# Patient Record
Sex: Female | Born: 1950
Health system: Southern US, Community
[De-identification: ages and names within clinical notes are randomized; demographics above are authoritative.]

## PROBLEM LIST (undated history)

## (undated) DIAGNOSIS — E039 Hypothyroidism, unspecified: Secondary | ICD-10-CM

## (undated) DIAGNOSIS — F419 Anxiety disorder, unspecified: Secondary | ICD-10-CM

## (undated) DIAGNOSIS — N879 Dysplasia of cervix uteri, unspecified: Secondary | ICD-10-CM

## (undated) DIAGNOSIS — T7840XA Allergy, unspecified, initial encounter: Secondary | ICD-10-CM

## (undated) DIAGNOSIS — K760 Fatty (change of) liver, not elsewhere classified: Secondary | ICD-10-CM

## (undated) HISTORY — PX: COLONOSCOPY: SHX174

## (undated) HISTORY — DX: Fatty (change of) liver, not elsewhere classified: K76.0

## (undated) HISTORY — PX: GYNECOLOGIC CRYOSURGERY: SHX857

## (undated) HISTORY — DX: Anxiety disorder, unspecified: F41.9

## (undated) HISTORY — DX: Hypothyroidism, unspecified: E03.9

## (undated) HISTORY — DX: Dysplasia of cervix uteri, unspecified: N87.9

## (undated) HISTORY — DX: Allergy, unspecified, initial encounter: T78.40XA

## (undated) HISTORY — PX: COLPOSCOPY: SHX161

---

## 1997-12-14 ENCOUNTER — Other Ambulatory Visit: Admission: RE | Admit: 1997-12-14 | Discharge: 1997-12-14 | Payer: Self-pay | Admitting: Gynecology

## 1998-12-27 ENCOUNTER — Other Ambulatory Visit: Admission: RE | Admit: 1998-12-27 | Discharge: 1998-12-27 | Payer: Self-pay | Admitting: Gynecology

## 2000-02-10 ENCOUNTER — Other Ambulatory Visit: Admission: RE | Admit: 2000-02-10 | Discharge: 2000-02-10 | Payer: Self-pay | Admitting: Gynecology

## 2001-02-21 ENCOUNTER — Other Ambulatory Visit: Admission: RE | Admit: 2001-02-21 | Discharge: 2001-02-21 | Payer: Self-pay | Admitting: Gynecology

## 2002-03-11 ENCOUNTER — Other Ambulatory Visit: Admission: RE | Admit: 2002-03-11 | Discharge: 2002-03-11 | Payer: Self-pay | Admitting: Gynecology

## 2002-03-15 ENCOUNTER — Other Ambulatory Visit: Admission: RE | Admit: 2002-03-15 | Discharge: 2002-03-15 | Payer: Self-pay | Admitting: Gynecology

## 2003-06-22 ENCOUNTER — Other Ambulatory Visit: Admission: RE | Admit: 2003-06-22 | Discharge: 2003-06-22 | Payer: Self-pay | Admitting: Gynecology

## 2004-03-16 ENCOUNTER — Other Ambulatory Visit: Admission: RE | Admit: 2004-03-16 | Discharge: 2004-03-16 | Payer: Self-pay | Admitting: Gynecology

## 2005-05-31 ENCOUNTER — Other Ambulatory Visit: Admission: RE | Admit: 2005-05-31 | Discharge: 2005-05-31 | Payer: Self-pay | Admitting: Gynecology

## 2006-07-02 ENCOUNTER — Ambulatory Visit: Payer: Self-pay | Admitting: Internal Medicine

## 2006-07-30 ENCOUNTER — Other Ambulatory Visit: Admission: RE | Admit: 2006-07-30 | Discharge: 2006-07-30 | Payer: Self-pay | Admitting: Gynecology

## 2006-10-15 ENCOUNTER — Ambulatory Visit: Payer: Self-pay | Admitting: Gastroenterology

## 2007-10-10 ENCOUNTER — Other Ambulatory Visit: Admission: RE | Admit: 2007-10-10 | Discharge: 2007-10-10 | Payer: Self-pay | Admitting: Gynecology

## 2008-05-26 ENCOUNTER — Ambulatory Visit: Payer: Self-pay | Admitting: Family Medicine

## 2009-09-01 ENCOUNTER — Other Ambulatory Visit: Admission: RE | Admit: 2009-09-01 | Discharge: 2009-09-01 | Payer: Self-pay | Admitting: Gynecology

## 2009-09-01 ENCOUNTER — Ambulatory Visit: Payer: Self-pay | Admitting: Gynecology

## 2010-08-01 ENCOUNTER — Ambulatory Visit: Payer: Self-pay | Admitting: Family Medicine

## 2010-09-05 ENCOUNTER — Encounter: Payer: Self-pay | Admitting: Gynecology

## 2010-09-06 ENCOUNTER — Other Ambulatory Visit: Payer: Self-pay | Admitting: Gynecology

## 2010-09-06 ENCOUNTER — Encounter (INDEPENDENT_AMBULATORY_CARE_PROVIDER_SITE_OTHER): Payer: BC Managed Care – PPO | Admitting: Gynecology

## 2010-09-06 ENCOUNTER — Other Ambulatory Visit (HOSPITAL_COMMUNITY)
Admission: RE | Admit: 2010-09-06 | Discharge: 2010-09-06 | Disposition: A | Discharge status: Home / Self Care | Payer: BC Managed Care – PPO | Source: Ambulatory Visit | Attending: Gynecology | Admitting: Gynecology

## 2010-09-06 DIAGNOSIS — Z01419 Encounter for gynecological examination (general) (routine) without abnormal findings: Secondary | ICD-10-CM

## 2010-09-06 DIAGNOSIS — Z124 Encounter for screening for malignant neoplasm of cervix: Secondary | ICD-10-CM | POA: Insufficient documentation

## 2010-09-06 DIAGNOSIS — R82998 Other abnormal findings in urine: Secondary | ICD-10-CM

## 2010-10-03 ENCOUNTER — Other Ambulatory Visit: Payer: Self-pay | Admitting: Gynecology

## 2010-10-03 DIAGNOSIS — Z5189 Encounter for other specified aftercare: Secondary | ICD-10-CM

## 2010-10-03 DIAGNOSIS — R82998 Other abnormal findings in urine: Secondary | ICD-10-CM

## 2010-10-10 ENCOUNTER — Other Ambulatory Visit: Payer: BC Managed Care – PPO

## 2010-10-20 ENCOUNTER — Ambulatory Visit: Payer: Self-pay | Admitting: Family Medicine

## 2011-08-25 IMAGING — CR DG CHEST 2V
1 series · 2 of 2 positions shown · non-contrast
Comparison: none

REASON FOR EXAM: cough
COMMENTS:

PROCEDURE:     KDR - KDXR CHEST PA (OR AP) AND LAT  - October 20, 2010  [DATE]
RESULT:     The lungs are well-expanded and clear. The cardiac silhouette is
normal in size. The pulmonary vascularity is not engorged. There is no
pleural effusion. The bony structures are grossly normal.

[Series 1: view not recorded · 0.17mm/px · 2 of 2 slices shown]
[im 1/2]
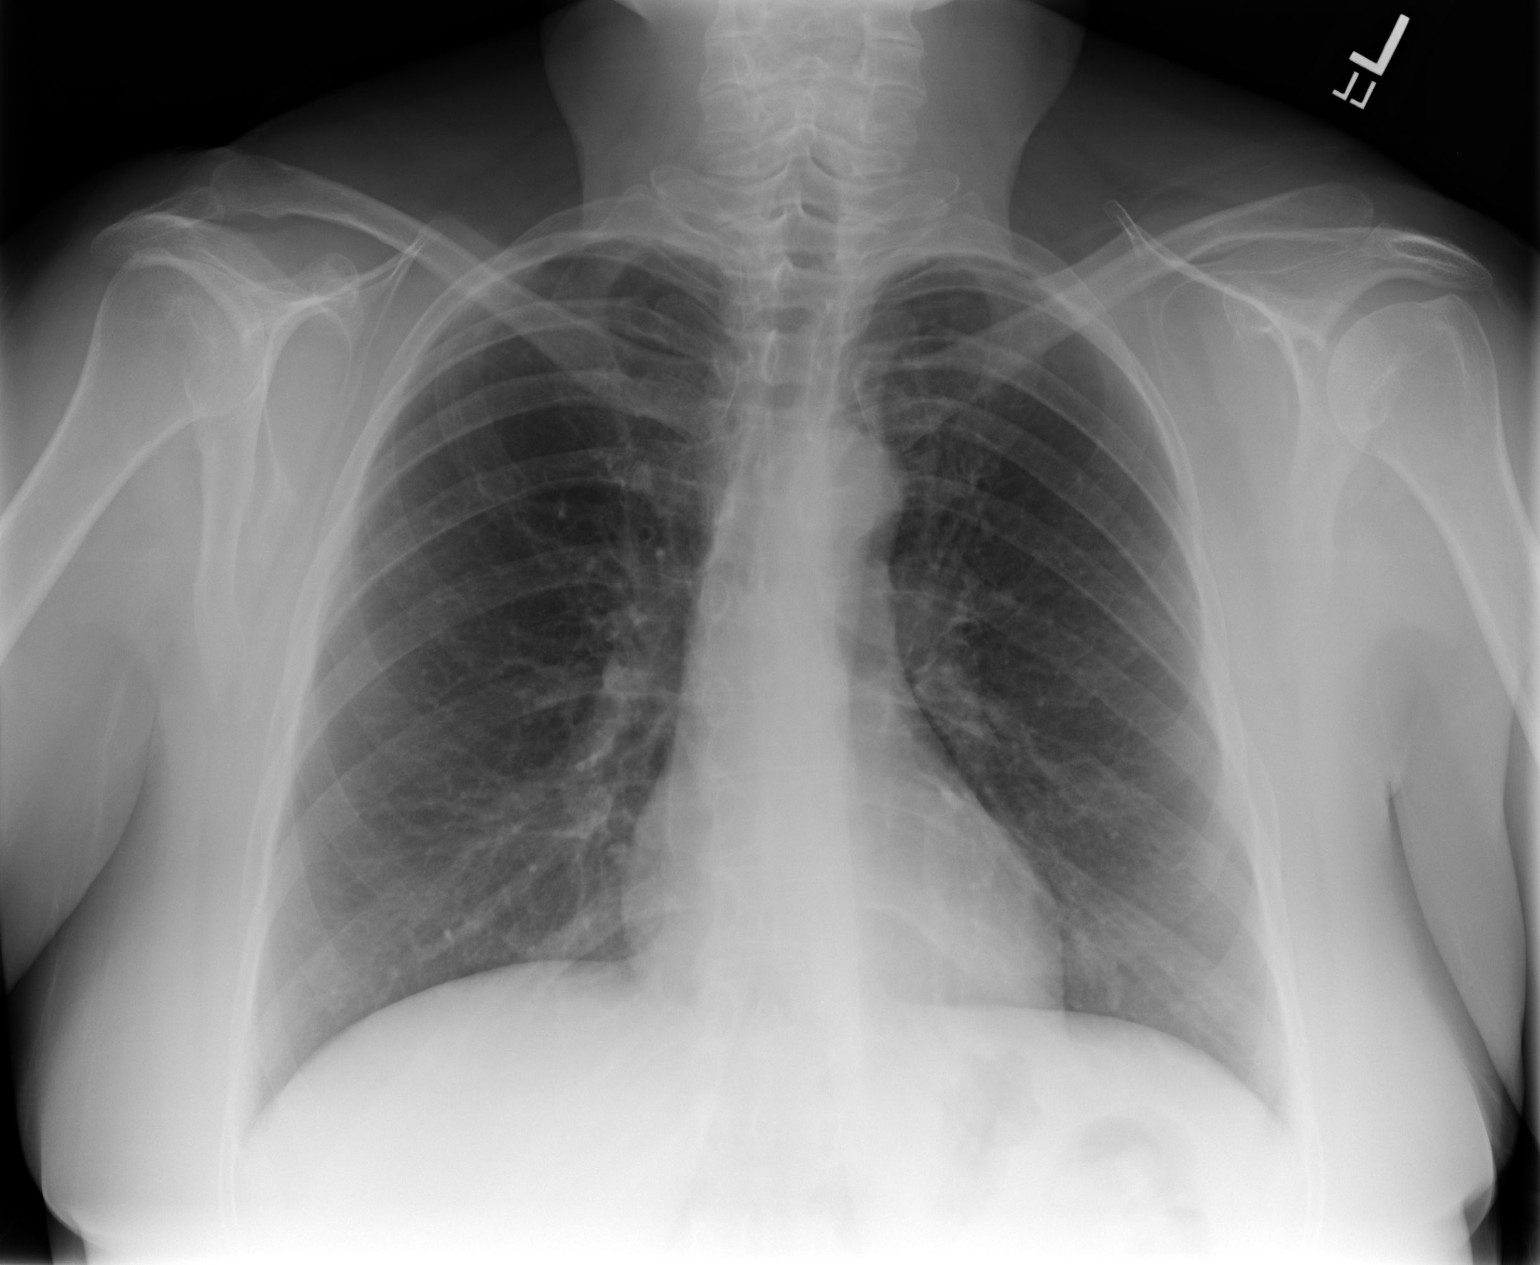
[im 2/2]
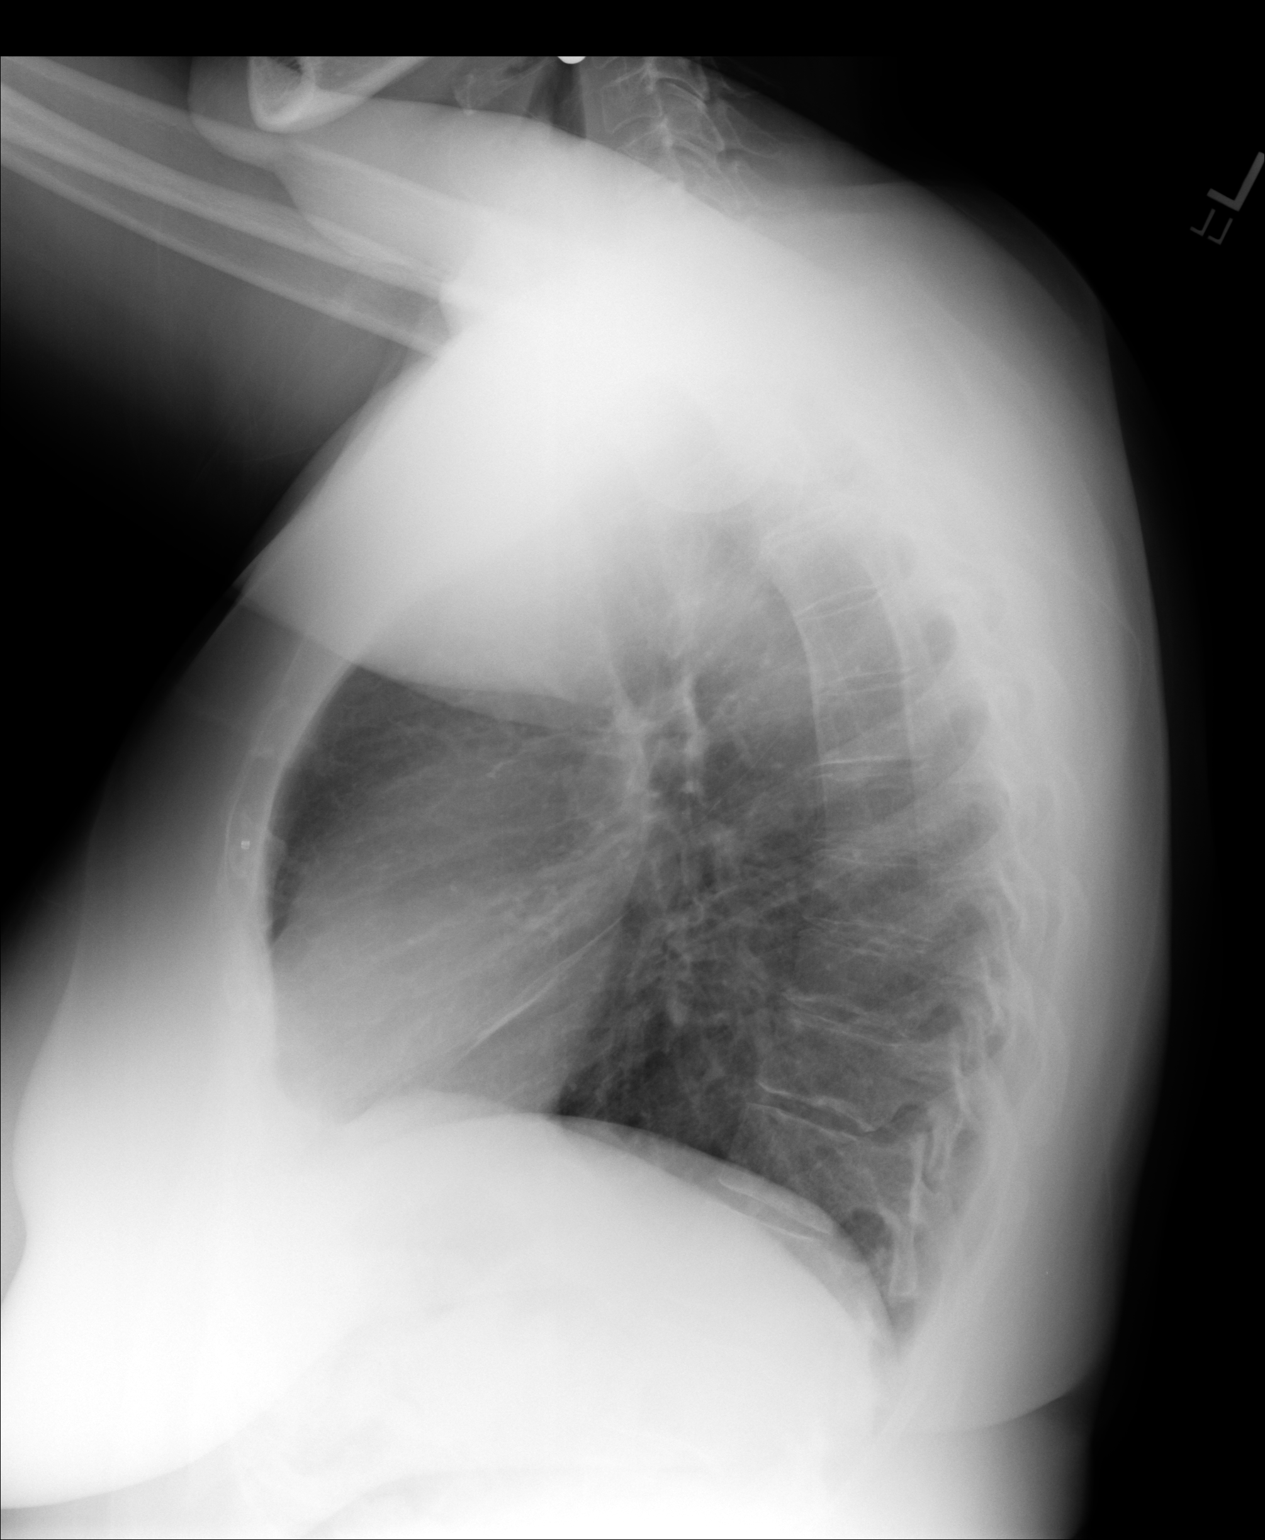

[2 of 2 positions shown; findings below may reference images not displayed]

IMPRESSION: I do not see evidence of acute cardiopulmonary abnormality.

## 2011-10-25 ENCOUNTER — Other Ambulatory Visit: Payer: Self-pay | Admitting: *Deleted

## 2011-10-25 ENCOUNTER — Encounter: Payer: Self-pay | Admitting: *Deleted

## 2011-10-25 MED ORDER — LEVOTHYROXINE SODIUM 88 MCG PO TABS: 88.0000 ug | ORAL_TABLET | Freq: Every day | ORAL | Status: DC

## 2011-10-25 NOTE — Progress Notes (Signed)
Patient ID: Christina Mcgee, female   DOB: [birth year], 61 y.o.   MRN: [id number] rx levothyroxine (synthroid ) 88 mcg #30 0 refills called into pharmacy. originally Rx was printed at office.

## 2011-10-25 NOTE — Addendum Note (Signed)
Addended by: Richardson Chiquito on: 10/25/2011 10:32 AM   Modules accepted: Orders

## 2011-10-25 NOTE — Telephone Encounter (Signed)
Tried calling pt to schedule aex. Her cellphone didn't have VM set up.

## 2012-06-14 ENCOUNTER — Encounter: Payer: BC Managed Care – PPO | Admitting: Gynecology

## 2012-06-21 ENCOUNTER — Encounter: Payer: Self-pay | Admitting: Gynecology

## 2012-06-21 ENCOUNTER — Ambulatory Visit (INDEPENDENT_AMBULATORY_CARE_PROVIDER_SITE_OTHER): Payer: BC Managed Care – PPO | Admitting: Gynecology

## 2012-06-21 VITALS — BP 120/76 | Ht 65.0 in | Wt 200.0 lb

## 2012-06-21 DIAGNOSIS — Z01419 Encounter for gynecological examination (general) (routine) without abnormal findings: Secondary | ICD-10-CM

## 2012-06-21 NOTE — Patient Instructions (Signed)
Follow up one year for annual exam 

## 2012-06-21 NOTE — Progress Notes (Signed)
Christina Mcgee [birth year] [id number]        62 y.o.  G2P2 for annual exam.  Doing well without complaints.  Past medical history,surgical history, medications, allergies, family history and social history were all reviewed and documented in the EPIC chart. ROS:  Was performed and pertinent positives and negatives are included in the history.  Exam: Kim assistant Filed Vitals:   06/21/12 1428  BP: 120/76  Height: 5\' 5"  (1.651 m)  Weight: 200 lb (90.719 kg)   General appearance  Normal Skin grossly normal Head/Neck normal with no cervical or supraclavicular adenopathy thyroid normal Lungs  clear Cardiac RR, without RMG Abdominal  soft, nontender, without masses, organomegaly or hernia Breasts  examined lying and sitting without masses, retractions, discharge or axillary adenopathy. Pelvic  Ext/BUS/vagina  normal   Cervix  normal   Uterus  axial, normal size, shape and contour, midline and mobile nontender   Adnexa  Without masses or tenderness    Anus and perineum  normal   Rectovaginal  normal sphincter tone without palpated masses or tenderness.    Assessment/Plan:  62 y.o. G2P2 female for annual exam.   1. Postmenopausal without significant symptoms. We'll continue to monitor.  Patient does report any bleeding. 2. Mammography 06/2012. Continued annual mammography. SBE monthly reviewed. 3. Pap smear 2012. No Pap smear done today. History of cryosurgery around age 61 with all subsequent Pap smears normal. Plan repeat Pap smear next year 3 year interval. 4. DEXA 2009 normal. Plan repeat closer to 65. Increase calcium vitamin D reviewed. Patient has been noted to be low on vitamin D previously have asked her to ensure she has this checked when she has routine blood work done at her primary physician's office. 5. Colonoscopy 5 years ago. Repeat at their recommended interval. 6. Health maintenance. No lab work done as it is all done through her primary physician's office. Follow up one  year, sooner as needed.    Dara Lords MD, 2:55 PM 06/21/2012

## 2012-06-25 ENCOUNTER — Encounter: Payer: Self-pay | Admitting: Gynecology

## 2012-06-27 ENCOUNTER — Encounter: Payer: Self-pay | Admitting: Gynecology

## 2013-07-28 ENCOUNTER — Ambulatory Visit (INDEPENDENT_AMBULATORY_CARE_PROVIDER_SITE_OTHER): Payer: No Typology Code available for payment source | Admitting: Family Medicine

## 2013-07-28 ENCOUNTER — Encounter: Payer: Self-pay | Admitting: Family Medicine

## 2013-07-28 ENCOUNTER — Other Ambulatory Visit: Payer: Self-pay | Admitting: Family Medicine

## 2013-07-28 VITALS — BP 120/78 | HR 61 | Temp 98.2°F | Resp 16 | Ht 65.0 in | Wt 195.0 lb

## 2013-07-28 DIAGNOSIS — Z0000 Encounter for general adult medical examination without abnormal findings: Secondary | ICD-10-CM

## 2013-07-28 DIAGNOSIS — F410 Panic disorder [episodic paroxysmal anxiety] without agoraphobia: Secondary | ICD-10-CM

## 2013-07-28 DIAGNOSIS — Z01419 Encounter for gynecological examination (general) (routine) without abnormal findings: Secondary | ICD-10-CM

## 2013-07-28 DIAGNOSIS — E039 Hypothyroidism, unspecified: Secondary | ICD-10-CM

## 2013-07-28 DIAGNOSIS — Z13220 Encounter for screening for lipoid disorders: Secondary | ICD-10-CM

## 2013-07-28 DIAGNOSIS — J3089 Other allergic rhinitis: Secondary | ICD-10-CM

## 2013-07-28 DIAGNOSIS — R413 Other amnesia: Secondary | ICD-10-CM

## 2013-07-28 DIAGNOSIS — F411 Generalized anxiety disorder: Secondary | ICD-10-CM

## 2013-07-28 DIAGNOSIS — Z131 Encounter for screening for diabetes mellitus: Secondary | ICD-10-CM

## 2013-07-28 LAB — POCT URINALYSIS DIPSTICK
BILIRUBIN UA: NEGATIVE
GLUCOSE UA: NEGATIVE
Ketones, UA: NEGATIVE
NITRITE UA: NEGATIVE
Protein, UA: NEGATIVE
SPEC GRAV UA: 1.01
Urobilinogen, UA: 0.2
pH, UA: 7

## 2013-07-28 MED ORDER — LEVOTHYROXINE SODIUM 88 MCG PO TABS: 88.0000 ug | ORAL_TABLET | Freq: Every day | ORAL | Status: DC

## 2013-07-28 MED ORDER — CITALOPRAM HYDROBROMIDE 20 MG PO TABS: 20.0000 mg | ORAL_TABLET | Freq: Every day | ORAL | Status: DC

## 2013-07-28 NOTE — Progress Notes (Signed)
Subjective:    Patient ID: Christina Mcgee, female    DOB: February 17, 1951, 63 y.o.   MRN: [id number]  HPI This chart was scribed for Wardell Honour, MD by Thea Alken, ED Scribe. This patient was seen in room 21 and the patient's care was started at 12:49 PM.  HPI Comments: Christina Mcgee is a 63 y.o. female who presents to the [facility] to re-establish care and for a physical. Pt reports anxiety attack 2 weeks ago that lasted a couple of hours. During the episode pt felt confused, apprehensive and nervous. She reports her sister calmed her down by talking her through the episode. At that time pt was tapering off celexa taking it once a day. Pt believes this may have been the cause of the episode. Pt denies SOB or palpitation during the attack. This was pt first anxiety attack and states she was under a lot of stress.   Pt c/o of confusion. She reports that she does not think as fast as she use to. She often tends to lose items. She also states she forgets why she goes to places like the grocery store..Pt is now more apprehensive about going places. Pt reports her thyroid was last checked by her chiropractor in the fall.   Pt reports worsening hearing. Pt denies tinnitus, HA, SOB, CP, dizziness and visual disturbance.  Pt denies cataract and glaucoma. Pt reports palpitation every once in while but denies daily palpitations. Pt denies numbness and tingling in legs or leg swelling. Pt denies change in BM. She denies abdominal pain, and bladder and bowel incontinence. She reports occasional  GERD. Pt reports she goes to bed around 11-12 and sleeps until 9-10.   Pt had c-section. Pt denies any other surgeries.  Pt has been happily married for 35 years with 3 children, twins 70, and 47. She also has 4 grandchildren. Pt lives with husband. 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 Pt is an occasion drinker. Pt exercises 1-2 times a week. Pt is considering weight watchers.  Pt denies allergies.  Pt mother passed away at 31 from leukemia. She also had breast cancer at 58. Pt father died ats 79 from CHF. He also had  HTN and high cholesterol. She denies him having and MI or TIA. Pt sister reports bladder cancer. Pt brother has HTN.     Pt last pap smear 09/06/10 normal.  Pt last physical was 1 year ago.  Pt last mammogram 1 year ago. Pt last colonoscopy over 2 years ago. TDAP-1 year ago Pt has not received flu shot this year. Pt is due for an eye exam.  Pt has seen dentist recently.   Past Medical History  Diagnosis Date  . Hypothyroid   . Cervical dysplasia age 76  . Anxiety   . Fatty liver disease, nonalcoholic     s/p GI consultation; [facility] in [place].   No Known Allergies Prior to Admission medications   Medication Sig Start Date End Date Taking? Authorizing Provider  Citalopram Hydrobromide (CELEXA PO) Take 20 mg by mouth daily.     Historical Provider, MD  levothyroxine (SYNTHROID) 88 MCG tablet Take 1 tablet (88 mcg total) by mouth daily. 10/25/11 10/24/12  Anastasio Auerbach, MD   Review of Systems  Constitutional: Negative for fever, chills, diaphoresis, activity change, appetite change, fatigue and unexpected weight change.  HENT: Negative for congestion, dental problem, drooling, ear discharge, ear  pain, facial swelling, hearing loss, mouth sores, nosebleeds, postnasal drip, rhinorrhea, sinus pressure, sneezing, sore throat, tinnitus, trouble swallowing and voice change.   Eyes: Negative for photophobia, pain, discharge, redness, itching and visual disturbance.  Respiratory: Negative for apnea, cough, choking, chest tightness, shortness of breath, wheezing and stridor.   Cardiovascular: Negative for chest pain, palpitations and leg swelling.  Gastrointestinal: Negative for nausea, vomiting, abdominal pain, diarrhea, constipation, blood in stool, abdominal distention, anal bleeding and rectal pain.  Endocrine: Negative for cold intolerance,  heat intolerance, polydipsia, polyphagia and polyuria.  Genitourinary: Negative for dysuria, urgency, frequency, hematuria, flank pain, decreased urine volume, vaginal bleeding, vaginal discharge, enuresis, difficulty urinating, genital sores, vaginal pain, menstrual problem, pelvic pain and dyspareunia.  Musculoskeletal: Negative for myalgias, back pain, joint swelling, arthralgias, gait problem, neck pain and neck stiffness.  Skin: Negative for color change, pallor, rash and wound.  Allergic/Immunologic: Negative for environmental allergies, food allergies and immunocompromised state.  Neurological: Negative for dizziness, tremors, seizures, syncope, facial asymmetry, speech difficulty, weakness, light-headedness, numbness and headaches.  Hematological: Negative for adenopathy. Does not bruise/bleed easily.  Psychiatric/Behavioral: Positive for decreased concentration. Negative for suicidal ideas, hallucinations, behavioral problems, confusion, sleep disturbance, self-injury, dysphoric mood and agitation. The patient is nervous/anxious. The patient is not hyperactive.    .      Objective:   Physical Exam  Constitutional: She is oriented to person, place, and time. She appears well-developed and well-nourished. No distress.  HENT:  Head: Normocephalic and atraumatic.  Right Ear: External ear normal.  Left Ear: External ear normal.  Nose: Nose normal.  Mouth/Throat: Oropharynx is clear and moist.  Eyes: Conjunctivae and EOM are normal. Pupils are equal, round, and reactive to light.  Neck: Normal range of motion and full passive range of motion without pain. Neck supple. No JVD present. Carotid bruit is not present. No thyromegaly present.  Cardiovascular: Normal rate, regular rhythm and normal heart sounds.  Exam reveals no gallop and no friction rub.   No murmur heard. Pulmonary/Chest: Effort normal and breath sounds normal. No respiratory distress. She has no wheezes. She has no rales.  She exhibits no tenderness. Right breast exhibits no inverted nipple, no mass, no nipple discharge, no skin change and no tenderness. Left breast exhibits no inverted nipple, no mass, no nipple discharge, no skin change and no tenderness. Breasts are symmetrical.  Abdominal: Soft. Bowel sounds are normal. She exhibits no distension and no mass. There is no tenderness. There is no rebound and no guarding.  Genitourinary: Vagina normal and uterus normal. There is no rash, tenderness, lesion or injury on the right labia. There is no rash, tenderness, lesion or injury on the left labia. Cervix exhibits motion tenderness. Right adnexum displays no mass, no tenderness and no fullness. Left adnexum displays no mass, no tenderness and no fullness.  Musculoskeletal: Normal range of motion.       Right shoulder: Normal.       Left shoulder: Normal.       Cervical back: Normal.  Lymphadenopathy:    She has no cervical adenopathy.  Neurological: She is alert and oriented to person, place, and time. She has normal reflexes. No cranial nerve deficit. She exhibits normal muscle tone. Coordination normal.  Skin: Skin is warm and dry. No rash noted. She is not diaphoretic. No erythema. No pallor.  Psychiatric: She has a normal mood and affect. Her behavior is normal. Judgment and thought content normal.  Nursing note and vitals reviewed.  Assessment & Plan:  Routine general medical examination at a health care facility - Plan: EKG 12-Lead, POCT urinalysis dipstick, Pap IG w/ reflex to HPV when ASC-U  Hypothyroidism, unspecified hypothyroidism type - Plan: TSH, levothyroxine (SYNTHROID) 88 MCG tablet  Generalized anxiety disorder - Plan: citalopram (CELEXA) 20 MG tablet  Panic attack - Plan: citalopram (CELEXA) 20 MG tablet  Memory difficulty - Plan: CBC with Differential, COMPLETE METABOLIC PANEL WITH GFR, TSH, POCT urinalysis dipstick  Other allergic rhinitis - Plan: loratadine (CLARITIN) 10 MG  tablet  Screening for diabetes mellitus - Plan: COMPLETE METABOLIC PANEL WITH GFR, Hemoglobin A1c  Screening, lipid - Plan: Lipid panel   1. Complete Physical Examination: anticipatory guidance --- weight loss, exercise, 3 servings of dairy daily.  Pap smear obtained; mammogram and colonoscopy UTD.  Immunizations UTD. 2.  Gyncological exam: Pap smear obtained; mammogram UTD.  Post-menopausal. 3.  Hypothyroidism: obtain labs; refill provided. 4.  Anxiety/panic attacks: worsening; refill of Citalopram provided; encourage pt to take daily. 5.  Allergic Rhinitis: Controlled; continue Claritin daily. 6. Memory issues: New. Obtain labs including TSH, B12.  RTC in upcoming three months to undergo MMSE and address further.   Meds ordered this encounter  Medications  . loratadine (CLARITIN) 10 MG tablet    Sig: Take 10 mg by mouth daily.  Marland Kitchen DISCONTD: levothyroxine (SYNTHROID) 88 MCG tablet    Sig: Take 1 tablet (88 mcg total) by mouth daily.    Dispense:  30 tablet    Refill:  11  . citalopram (CELEXA) 20 MG tablet    Sig: Take 1 tablet (20 mg total) by mouth daily.    Dispense:  30 tablet    Refill:  11  . levothyroxine (SYNTHROID) 88 MCG tablet    Sig: Take 1 tablet (88 mcg total) by mouth daily.    Dispense:  30 tablet    Refill:  11   I personally performed the services described in this documentation, which was scribed in my presence.  The recorded information has been reviewed and is accurate.  Reginia Forts, M.D.  [facility] [address] [place], [place]  [address] [phone] phone [phone] fax

## 2013-07-29 LAB — CBC WITH DIFFERENTIAL/PLATELET
BASOS ABS: 0 10*3/uL (ref 0.0–0.1)
BASOS PCT: 0 % (ref 0–1)
EOS PCT: 2 % (ref 0–5)
Eosinophils Absolute: 0.1 10*3/uL (ref 0.0–0.7)
HEMATOCRIT: 40.8 % (ref 36.0–46.0)
Hemoglobin: 13.6 g/dL (ref 12.0–15.0)
Lymphocytes Relative: 38 % (ref 12–46)
Lymphs Abs: 2.5 10*3/uL (ref 0.7–4.0)
MCH: 30.4 pg (ref 26.0–34.0)
MCHC: 33.3 g/dL (ref 30.0–36.0)
MCV: 91.3 fL (ref 78.0–100.0)
MONO ABS: 0.5 10*3/uL (ref 0.1–1.0)
Monocytes Relative: 8 % (ref 3–12)
NEUTROS ABS: 3.4 10*3/uL (ref 1.7–7.7)
Neutrophils Relative %: 52 % (ref 43–77)
Platelets: 258 10*3/uL (ref 150–400)
RBC: 4.47 MIL/uL (ref 3.87–5.11)
RDW: 12.7 % (ref 11.5–15.5)
WBC: 6.6 10*3/uL (ref 4.0–10.5)

## 2013-07-29 LAB — COMPLETE METABOLIC PANEL WITH GFR
ALK PHOS: 75 U/L (ref 39–117)
ALT: 51 U/L — ABNORMAL HIGH (ref 0–35)
AST: 34 U/L (ref 0–37)
Albumin: 4.4 g/dL (ref 3.5–5.2)
BILIRUBIN TOTAL: 0.6 mg/dL (ref 0.2–1.2)
BUN: 12 mg/dL (ref 6–23)
CO2: 28 mEq/L (ref 19–32)
CREATININE: 0.7 mg/dL (ref 0.50–1.10)
Calcium: 10.3 mg/dL (ref 8.4–10.5)
Chloride: 104 mEq/L (ref 96–112)
GFR, Est African American: >89 mL/min
GFR, Est Non African American: >89 mL/min
Glucose, Bld: 92 mg/dL (ref 70–99)
Potassium: 5.1 mEq/L (ref 3.5–5.3)
Sodium: 139 mEq/L (ref 135–145)
Total Protein: 7.4 g/dL (ref 6.0–8.3)

## 2013-07-29 LAB — PAP IG W/ RFLX HPV ASCU

## 2013-07-29 LAB — LIPID PANEL
Cholesterol: 192 mg/dL (ref 0–200)
HDL: 58 mg/dL (ref >39)
LDL Cholesterol: 122 mg/dL — ABNORMAL HIGH (ref 0–99)
TRIGLYCERIDES: 61 mg/dL (ref <150)
Total CHOL/HDL Ratio: 3.3 Ratio
VLDL: 12 mg/dL (ref 0–40)

## 2013-07-29 LAB — HEMOGLOBIN A1C
HEMOGLOBIN A1C: 5.4 % (ref <5.7)
MEAN PLASMA GLUCOSE: 108 mg/dL (ref <117)

## 2013-07-29 LAB — TSH: TSH: 1.936 u[IU]/mL (ref 0.350–4.500)

## 2013-07-30 LAB — RPR

## 2013-07-30 LAB — VITAMIN B12: VITAMIN B 12: 306 pg/mL (ref 211–911)

## 2013-08-06 LAB — HUMAN PAPILLOMAVIRUS, HIGH RISK: HPV DNA High Risk: NOT DETECTED

## 2013-10-20 ENCOUNTER — Ambulatory Visit (INDEPENDENT_AMBULATORY_CARE_PROVIDER_SITE_OTHER): Payer: No Typology Code available for payment source | Admitting: Family Medicine

## 2013-10-20 ENCOUNTER — Encounter: Payer: Self-pay | Admitting: Family Medicine

## 2013-10-20 VITALS — BP 117/70 | HR 64 | Temp 98.0°F | Resp 16 | Ht 64.75 in | Wt 199.4 lb

## 2013-10-20 DIAGNOSIS — E669 Obesity, unspecified: Secondary | ICD-10-CM

## 2013-10-20 DIAGNOSIS — F329 Major depressive disorder, single episode, unspecified: Secondary | ICD-10-CM

## 2013-10-20 DIAGNOSIS — R413 Other amnesia: Secondary | ICD-10-CM

## 2013-10-20 DIAGNOSIS — F3289 Other specified depressive episodes: Secondary | ICD-10-CM

## 2013-10-20 DIAGNOSIS — K760 Fatty (change of) liver, not elsewhere classified: Secondary | ICD-10-CM

## 2013-10-20 DIAGNOSIS — E039 Hypothyroidism, unspecified: Secondary | ICD-10-CM

## 2013-10-20 DIAGNOSIS — R748 Abnormal levels of other serum enzymes: Secondary | ICD-10-CM

## 2013-10-20 DIAGNOSIS — F32A Depression, unspecified: Secondary | ICD-10-CM

## 2013-10-20 DIAGNOSIS — K7689 Other specified diseases of liver: Secondary | ICD-10-CM

## 2013-10-20 LAB — COMPREHENSIVE METABOLIC PANEL
ALBUMIN: 4.4 g/dL (ref 3.5–5.2)
ALK PHOS: 64 U/L (ref 39–117)
ALT: 23 U/L (ref 0–35)
AST: 24 U/L (ref 0–37)
BUN: 11 mg/dL (ref 6–23)
CALCIUM: 9.8 mg/dL (ref 8.4–10.5)
CHLORIDE: 104 meq/L (ref 96–112)
CO2: 26 mEq/L (ref 19–32)
Creat: 0.61 mg/dL (ref 0.50–1.10)
GLUCOSE: 79 mg/dL (ref 70–99)
Potassium: 4.5 mEq/L (ref 3.5–5.3)
Sodium: 137 mEq/L (ref 135–145)
TOTAL PROTEIN: 7 g/dL (ref 6.0–8.3)
Total Bilirubin: 0.6 mg/dL (ref 0.2–1.2)

## 2013-10-20 LAB — HEPATITIS PANEL, ACUTE
HCV AB: NEGATIVE
HEP B C IGM: NONREACTIVE
Hep A IgM: NONREACTIVE
Hepatitis B Surface Ag: NEGATIVE

## 2013-10-20 NOTE — Progress Notes (Signed)
Subjective:  This chart was scribed for Wardell Honour, MD by Thea Alken, ED Scribe. This patient was seen in room 21 and the patient's care was started at 8:07 AM.   Patient ID: Christina Mcgee, female    DOB: 1950/09/20, 63 y.o.   MRN: [id number]  HPI  HPI Comments: Christina Mcgee is a 63 y.o. female who presents to the [facility] here for a 3 month f/u regarding anxiety and memory. Loss.  During last visit, 07/28/2013,  pt had an elevated liver function test, TSH was normal, Vitamin B12 was normal and RPR was negative. Pt pap smear was normal.Pt has not had a mammogram this year and is planning to schedule appointment soon. Pt denies using tylenol products. Pt has been seen [facility]  for possible fatty liver. She received and ultrasound at that time. Pt denies receiving a hepatitis vaccinations with diagnosis of fatty liver by [facility].   ANXIETY.  Pt is still taking celexa and is not ready to discontinue medication at this time. Pt reports medication has been causing "crazy" dreams but report they are tolerable. She denies them being nightmares. But reports she is sleeping well and feels fully rested in the morning. She denies wanting to try a new medication and would rather stick to a medication that she knows.    MEMORY LOSS.  Pt reports memory loss has improved. Pt has accepted this may be from aging and recent stressors. In the past 3 months she denies trouble finding her way to the grocery store and misplacing items often.  WEIGHT LOSS Pt wants to lose weight. She is currently up 4lbs from last visit. Pt reports she has been trying to reduce caloric intake. Pt reports she started going to the [employer] 1 month ago but it being the summer she has been taking trips.   Pt c/o intermittent itching in bilateral ears. She denies irritation at this time.   Past Medical History  Diagnosis Date  . Hypothyroid   . Cervical dysplasia age 55  . Anxiety   . Fatty liver disease,  nonalcoholic     s/p GI consultation; [facility] in [place].   Past Surgical History  Procedure Laterality Date  . Cesarean section    . Colposcopy    . Gynecologic cryosurgery  age 33   Prior to Admission medications   Medication Sig Start Date End Date Taking? Authorizing Provider  citalopram (CELEXA) 20 MG tablet Take 1 tablet (20 mg total) by mouth daily. 07/28/13   Wardell Honour, MD  levothyroxine (SYNTHROID) 88 MCG tablet Take 1 tablet (88 mcg total) by mouth daily. 07/28/13 07/28/14  Wardell Honour, MD  loratadine (CLARITIN) 10 MG tablet Take 10 mg by mouth daily.    Historical Provider, MD   Review of Systems  Constitutional: Negative for fever and chills.  Gastrointestinal: Negative for nausea, vomiting, abdominal pain, diarrhea and constipation.  Neurological: Negative for dizziness, tremors, seizures, syncope, facial asymmetry, speech difficulty, weakness, light-headedness, numbness and headaches.  Psychiatric/Behavioral: Negative for suicidal ideas, confusion, sleep disturbance, self-injury and dysphoric mood. The patient is nervous/anxious.    History   Social History  . Marital Status: Married    Spouse Name: N/A    Number of Children: N/A  . Years of Education: N/A   Occupational History  . Not on file.   Social History Main Topics  . Smoking status: Former Research scientist (life sciences)  . Smokeless tobacco: Not on file  . Alcohol  Use: Yes     Comment: Occas  . Drug Use: No  . Sexual Activity: Yes    Birth Control/ Protection: Post-menopausal   Other Topics Concern  . Not on file   Social History Narrative   Marital status: married x 29 years; happily married; no abuse       Children:  3 children (37, 38, 77); 4 grandchildren.       Lives: with husband.      Employment:  [employer]ner, broadcasting/film/video; also works with [employer].      Tobacco; none       Alcohol:  Socially      Exercise:  1-2 times per week.       Objective:   Physical Exam  Nursing note and vitals  reviewed. Constitutional: She is oriented to person, place, and time. She appears well-developed and well-nourished. No distress.  HENT:  Head: Normocephalic and atraumatic.  Right Ear: External ear normal.  Left Ear: External ear normal.  Nose: Nose normal.  Mouth/Throat: Oropharynx is clear and moist.  Eyes: Conjunctivae and EOM are normal. Pupils are equal, round, and reactive to light.  Neck: Normal range of motion. Neck supple. Carotid bruit is not present. No thyromegaly present.  Cardiovascular: Normal rate, regular rhythm, normal heart sounds and intact distal pulses.  Exam reveals no gallop and no friction rub.   No murmur heard. Pulmonary/Chest: Effort normal and breath sounds normal. She has no wheezes. She has no rales.  Abdominal: Soft. Bowel sounds are normal. She exhibits no distension and no mass. There is no tenderness. There is no rebound and no guarding.  Musculoskeletal: Normal range of motion.  Lymphadenopathy:    She has no cervical adenopathy.  Neurological: She is alert and oriented to person, place, and time. No cranial nerve deficit.  Skin: Skin is warm and dry. No rash noted. She is not diaphoretic. No erythema. No pallor.  Psychiatric: She has a normal mood and affect. Her behavior is normal.   Filed Vitals:   10/20/13 0814  BP: 117/70  Pulse: 64  Temp: 98 F (36.7 C)  Resp: 16     Assessment & Plan:   1. Increased liver enzymes   2. Memory loss   3. Obesity, unspecified   4. Depression   5. Unspecified hypothyroidism   6. Fatty liver disease, nonalcoholic    1. Increased liver function tests: Recurrent at last visit; repeat today; s/p GI consultation in the past by Dr. Dionne Milo at [facility]; diagnosed with non-alcoholic fatty liver.  Recommend weight loss, exercise, low fat food choices. 2.  Fatty liver non-alcoholic:  Chronic; as above; s/p GI consultation in the past; warrants Hepatitis A and B series at next visit. 3.  Obesity: recommend weight  loss, exercise. Consider [employer] or [employer]. Also recommend increasing exercise to five days per week. 4.  Hypothyroidism: controlled.  No changes in therapy. 5.  Depression with anxiety: stable and improved.  Continue current dose of medication. 6. Memory Loss; improved since last visit; pt feels stress related and age related. No recent issues.    I personally performed the services described in this documentation, which was scribed in my presence.  The recorded information has been reviewed and is accurate.  Reginia Forts, M.D.  [facility] [address] [place], [place]  [address] [phone] phone [phone] fax

## 2013-10-20 NOTE — Patient Instructions (Signed)
1. RECOMMEND GETTING A FLU VACCINE IN October 2015.

## 2013-11-08 ENCOUNTER — Encounter: Payer: Self-pay | Admitting: Family Medicine

## 2013-12-26 DIAGNOSIS — Z0271 Encounter for disability determination: Secondary | ICD-10-CM

## 2014-01-12 ENCOUNTER — Encounter: Payer: Self-pay | Admitting: Family Medicine

## 2014-07-14 ENCOUNTER — Telehealth: Payer: Self-pay

## 2014-07-14 NOTE — Telephone Encounter (Signed)
Pt states she is receiving a bill from [facility] related to services rendered in our office on 07/28/13. Pt states we sent "the wrong dx code" to [facility] and that is why her insurance denied the bill from [facility].  Pt states dx sent was for hypertension and it should have been in regards to her CPE. Please forward corrected information to [facility] if applicable

## 2014-07-20 NOTE — Telephone Encounter (Signed)
Please address if possible.

## 2014-07-21 NOTE — Telephone Encounter (Signed)
Spoke with [employer]. V70.0 (CPE) was used as primary dx code last year and ins denied coverage for all of her tests. Gave them some of the other codes from her visit to try. Pt notified and will call us back if she continues to have complications.

## 2014-08-20 ENCOUNTER — Encounter: Payer: Self-pay | Admitting: *Deleted

## 2014-08-24 ENCOUNTER — Other Ambulatory Visit: Payer: Self-pay | Admitting: Family Medicine

## 2014-09-19 ENCOUNTER — Other Ambulatory Visit: Payer: Self-pay | Admitting: Family Medicine

## 2014-10-07 ENCOUNTER — Other Ambulatory Visit: Payer: Self-pay | Admitting: Family Medicine

## 2014-11-13 ENCOUNTER — Ambulatory Visit (INDEPENDENT_AMBULATORY_CARE_PROVIDER_SITE_OTHER): Payer: No Typology Code available for payment source | Admitting: Family Medicine

## 2014-11-13 ENCOUNTER — Encounter: Payer: Self-pay | Admitting: Family Medicine

## 2014-11-13 VITALS — BP 111/67 | HR 60 | Temp 98.4°F | Resp 16 | Ht 65.0 in | Wt 204.2 lb

## 2014-11-13 DIAGNOSIS — F419 Anxiety disorder, unspecified: Secondary | ICD-10-CM

## 2014-11-13 DIAGNOSIS — F329 Major depressive disorder, single episode, unspecified: Secondary | ICD-10-CM

## 2014-11-13 DIAGNOSIS — N898 Other specified noninflammatory disorders of vagina: Secondary | ICD-10-CM | POA: Diagnosis not present

## 2014-11-13 DIAGNOSIS — E034 Atrophy of thyroid (acquired): Secondary | ICD-10-CM | POA: Diagnosis not present

## 2014-11-13 DIAGNOSIS — E669 Obesity, unspecified: Secondary | ICD-10-CM

## 2014-11-13 DIAGNOSIS — K760 Fatty (change of) liver, not elsewhere classified: Secondary | ICD-10-CM | POA: Diagnosis not present

## 2014-11-13 DIAGNOSIS — Z23 Encounter for immunization: Secondary | ICD-10-CM | POA: Diagnosis not present

## 2014-11-13 DIAGNOSIS — Z0000 Encounter for general adult medical examination without abnormal findings: Secondary | ICD-10-CM | POA: Diagnosis not present

## 2014-11-13 DIAGNOSIS — Z114 Encounter for screening for human immunodeficiency virus [HIV]: Secondary | ICD-10-CM | POA: Diagnosis not present

## 2014-11-13 DIAGNOSIS — H9191 Unspecified hearing loss, right ear: Secondary | ICD-10-CM | POA: Diagnosis not present

## 2014-11-13 DIAGNOSIS — Z13220 Encounter for screening for lipoid disorders: Secondary | ICD-10-CM | POA: Diagnosis not present

## 2014-11-13 DIAGNOSIS — Z131 Encounter for screening for diabetes mellitus: Secondary | ICD-10-CM | POA: Diagnosis not present

## 2014-11-13 DIAGNOSIS — F418 Other specified anxiety disorders: Secondary | ICD-10-CM | POA: Diagnosis not present

## 2014-11-13 DIAGNOSIS — E038 Other specified hypothyroidism: Secondary | ICD-10-CM

## 2014-11-13 DIAGNOSIS — F32A Depression, unspecified: Secondary | ICD-10-CM

## 2014-11-13 LAB — POCT URINALYSIS DIPSTICK
Bilirubin, UA: NEGATIVE
Blood, UA: NEGATIVE
GLUCOSE UA: NEGATIVE
KETONES UA: NEGATIVE
Nitrite, UA: NEGATIVE
Protein, UA: NEGATIVE
SPEC GRAV UA: 1.01
Urobilinogen, UA: 1
pH, UA: 7

## 2014-11-13 LAB — CBC WITH DIFFERENTIAL/PLATELET
Basophils Absolute: 0 10*3/uL (ref 0.0–0.1)
Basophils Relative: 0 % (ref 0–1)
Eosinophils Absolute: 0.2 10*3/uL (ref 0.0–0.7)
Eosinophils Relative: 4 % (ref 0–5)
HCT: 39.2 % (ref 36.0–46.0)
Hemoglobin: 13.1 g/dL (ref 12.0–15.0)
LYMPHS PCT: 36 % (ref 12–46)
Lymphs Abs: 2.1 10*3/uL (ref 0.7–4.0)
MCH: 30.9 pg (ref 26.0–34.0)
MCHC: 33.4 g/dL (ref 30.0–36.0)
MCV: 92.5 fL (ref 78.0–100.0)
MONO ABS: 0.6 10*3/uL (ref 0.1–1.0)
MONOS PCT: 10 % (ref 3–12)
MPV: 10.9 fL (ref 8.6–12.4)
NEUTROS ABS: 3 10*3/uL (ref 1.7–7.7)
Neutrophils Relative %: 50 % (ref 43–77)
Platelets: 254 10*3/uL (ref 150–400)
RBC: 4.24 MIL/uL (ref 3.87–5.11)
RDW: 13 % (ref 11.5–15.5)
WBC: 5.9 10*3/uL (ref 4.0–10.5)

## 2014-11-13 LAB — LIPID PANEL
CHOL/HDL RATIO: 2.9 ratio (ref <=5.0)
Cholesterol: 174 mg/dL (ref 125–200)
HDL: 60 mg/dL (ref >=46)
LDL Cholesterol: 101 mg/dL (ref <130)
Triglycerides: 67 mg/dL (ref <150)
VLDL: 13 mg/dL (ref <30)

## 2014-11-13 LAB — POCT WET PREP WITH KOH
CLUE CELLS WET PREP PER HPF POC: NEGATIVE
KOH Prep POC: NEGATIVE
Trichomonas, UA: NEGATIVE
YEAST WET PREP PER HPF POC: NEGATIVE

## 2014-11-13 LAB — T4, FREE: Free T4: 1.64 ng/dL (ref 0.80–1.80)

## 2014-11-13 LAB — COMPREHENSIVE METABOLIC PANEL
ALT: 20 U/L (ref 6–29)
AST: 19 U/L (ref 10–35)
Albumin: 4.2 g/dL (ref 3.6–5.1)
Alkaline Phosphatase: 58 U/L (ref 33–130)
BILIRUBIN TOTAL: 0.6 mg/dL (ref 0.2–1.2)
BUN: 12 mg/dL (ref 7–25)
CALCIUM: 9.6 mg/dL (ref 8.6–10.4)
CO2: 27 mmol/L (ref 20–31)
Chloride: 103 mmol/L (ref 98–110)
Creat: 0.94 mg/dL (ref 0.50–0.99)
Glucose, Bld: 87 mg/dL (ref 65–99)
Potassium: 4.3 mmol/L (ref 3.5–5.3)
Sodium: 139 mmol/L (ref 135–146)
TOTAL PROTEIN: 7 g/dL (ref 6.1–8.1)

## 2014-11-13 LAB — HEMOGLOBIN A1C
HEMOGLOBIN A1C: 5.2 % (ref <5.7)
MEAN PLASMA GLUCOSE: 103 mg/dL (ref <117)

## 2014-11-13 LAB — TSH: TSH: 0.901 u[IU]/mL (ref 0.350–4.500)

## 2014-11-13 LAB — VITAMIN B12: Vitamin B-12: 409 pg/mL (ref 211–911)

## 2014-11-13 MED ORDER — CITALOPRAM HYDROBROMIDE 20 MG PO TABS: ORAL_TABLET | ORAL | Status: DC

## 2014-11-13 MED ORDER — LEVOTHYROXINE SODIUM 88 MCG PO TABS: 88.0000 ug | ORAL_TABLET | Freq: Every day | ORAL | Status: DC

## 2014-11-13 NOTE — Patient Instructions (Signed)

## 2014-11-13 NOTE — Progress Notes (Signed)
Subjective:    Patient ID: Christina Mcgee, female    DOB: 12/30/1950, 64 y.o.   MRN: [id number]  11/13/2014  Annual Exam   HPI This 64 y.o. female presents for Complete Physical Examination.  Last physical:  07-28-2013 Pap smear:  07-28-2013  WNL HPV negative Mammogram:  06-21-2012 Colonoscopy:  2008; repeat in unsure.  [facility].   Bone density:  2009 TDAP:2012 Pneumovax:  never Zostavax: never Influenza:  received Eye exam:  +glasses; due; 2014 Dental exam:  Every six months.    Review of Systems  Constitutional: Negative for fever, chills, diaphoresis, activity change, appetite change, fatigue and unexpected weight change.  HENT: Positive for hearing loss and postnasal drip. Negative for congestion, dental problem, drooling, ear discharge, ear pain, facial swelling, mouth sores, nosebleeds, rhinorrhea, sinus pressure, sneezing, sore throat, tinnitus, trouble swallowing and voice change.   Eyes: Negative for photophobia, pain, discharge, redness, itching and visual disturbance.  Respiratory: Negative for apnea, cough, choking, chest tightness, shortness of breath, wheezing and stridor.   Cardiovascular: Negative for chest pain, palpitations and leg swelling.  Gastrointestinal: Negative for nausea, vomiting, abdominal pain, diarrhea, constipation, blood in stool, abdominal distention, anal bleeding and rectal pain.  Endocrine: Negative for cold intolerance, heat intolerance, polydipsia, polyphagia and polyuria.  Genitourinary: Positive for vaginal discharge. Negative for dysuria, urgency, frequency, hematuria, flank pain, decreased urine volume, vaginal bleeding, enuresis, difficulty urinating, genital sores, vaginal pain, menstrual problem, pelvic pain and dyspareunia.  Musculoskeletal: Negative for myalgias, back pain, joint swelling, arthralgias, gait problem, neck pain and neck stiffness.  Skin: Negative for color change, pallor, rash and wound.  Allergic/Immunologic: Negative for  environmental allergies, food allergies and immunocompromised state.  Neurological: Negative for dizziness, tremors, seizures, syncope, facial asymmetry, speech difficulty, weakness, light-headedness, numbness and headaches.  Hematological: Negative for adenopathy. Does not bruise/bleed easily.  Psychiatric/Behavioral: Negative for suicidal ideas, hallucinations, behavioral problems, confusion, sleep disturbance, self-injury, dysphoric mood, decreased concentration and agitation. The patient is not nervous/anxious and is not hyperactive.     Past Medical History  Diagnosis Date  . Hypothyroid   . Cervical dysplasia age 40  . Anxiety   . Fatty liver disease, nonalcoholic     s/p GI consultation; [facility] in [place].   Past Surgical History  Procedure Laterality Date  . Cesarean section    . Colposcopy    . Gynecologic cryosurgery  age 37   No Known Allergies Current Outpatient Prescriptions  Medication Sig Dispense Refill  . Biotin 1 MG CAPS Take by mouth.    . Cholecalciferol (VITAMIN D) 400 UNIT/ML LIQD Take by mouth.    . citalopram (CELEXA) 20 MG tablet TAKE ONE (1) TABLET EACH DAY. 90 tablet 3  . levothyroxine (SYNTHROID, LEVOTHROID) 88 MCG tablet Take 1 tablet (88 mcg total) by mouth daily before breakfast. 90 tablet 3  . loratadine (CLARITIN) 10 MG tablet Take 10 mg by mouth daily.     No current facility-administered medications for this visit.   Social History   Social History  . Marital Status: Married    Spouse Name: N/A  . Number of Children: N/A  . Years of Education: N/A   Occupational History  . Not on file.   Social History Main Topics  . Smoking status: Former Research scientist (life sciences)  . Smokeless tobacco: Not on file  . Alcohol Use: Yes     Comment: Occas  . Drug Use: No  . Sexual Activity: Yes    Birth Control/ Protection: Post-menopausal   Other  Topics Concern  . Not on file   Social History Narrative   Marital status: married x 40 years; happily married; no  abuse       Children:  3 children (63, 87, 79); 4 grandchildren.       Lives: with husband, youngest son.      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      Tobacco; none       Alcohol:  Socially sporadically; once per week.      Exercise:  1-2 times per week at most.      Seatbelt:  100%      Guns: yes; unloaded.   Family History  Problem Relation Age of Onset  . Breast cancer Mother 3  . Leukemia Mother   . Cancer Mother     leukemia; breast cancer at age 39.  Marland Kitchen Hypertension Father   . Heart disease Father 94    CHF  . Hyperlipidemia Father   . Cancer Sister     Bladder  . Hyperlipidemia Brother   . Hypertension Brother        Objective:    BP 111/67 mmHg  Pulse 60  Temp(Src) 98.4 F (36.9 C) (Oral)  Resp 16  Ht 5\' 5"  (1.651 m)  Wt 204 lb 3.2 oz (92.625 kg)  BMI 33.98 kg/m2 Physical Exam  Constitutional: She is oriented to person, place, and time. She appears well-developed and well-nourished. No distress.  Obese  HENT:  Head: Normocephalic and atraumatic.  Right Ear: External ear normal.  Left Ear: External ear normal.  Nose: Nose normal.  Mouth/Throat: Oropharynx is clear and moist.  Eyes: Conjunctivae and EOM are normal. Pupils are equal, round, and reactive to light.  Neck: Normal range of motion and full passive range of motion without pain. Neck supple. No JVD present. Carotid bruit is not present. No thyromegaly present.  Cardiovascular: Normal rate, regular rhythm and normal heart sounds.  Exam reveals no gallop and no friction rub.   No murmur heard. Pulmonary/Chest: Effort normal and breath sounds normal. She has no wheezes. She has no rales. Right breast exhibits no inverted nipple, no mass, no nipple discharge, no skin change and no tenderness. Left breast exhibits no inverted nipple, no mass, no nipple discharge, no skin change and no tenderness. Breasts are symmetrical.  Abdominal: Soft. Bowel sounds are normal. She  exhibits no distension and no mass. There is no tenderness. There is no rebound and no guarding.  Genitourinary: Uterus normal. There is no rash, tenderness, lesion or injury on the right labia. There is no rash, tenderness, lesion or injury on the left labia. Right adnexum displays no mass, no tenderness and no fullness. Left adnexum displays no mass, no tenderness and no fullness. No erythema or bleeding in the vagina. Vaginal discharge found.  Musculoskeletal:       Right shoulder: Normal.       Left shoulder: Normal.       Cervical back: Normal.  Lymphadenopathy:    She has no cervical adenopathy.  Neurological: She is alert and oriented to person, place, and time. She has normal reflexes. No cranial nerve deficit. She exhibits normal muscle tone. Coordination normal.  Skin: Skin is warm and dry. No rash noted. She is not diaphoretic. No erythema. No pallor.  Psychiatric: She has a normal mood and affect. Her behavior is normal. Judgment and thought content normal.  Nursing note and vitals reviewed.  INFLUENZA VACCINE ADMINISTERED.  AUDIOMETRY COMPLETED.     Assessment & Plan:   1. Routine physical examination   2. Hypothyroidism due to acquired atrophy of thyroid   3. Screening for diabetes mellitus   4. Screening, lipid   5. Fatty liver disease, nonalcoholic   6. Anxiety and depression   7. Obesity   8. Need for prophylactic vaccination and inoculation against influenza   9. Screening for HIV (human immunodeficiency virus)   10. Vaginal discharge   11. Hearing loss, right     1. Complete Physical Examination: anticipatory guidance --- weight loss, exercise, calcium 1200mg  daily.  Pap smear UTD. Pt declined/refused mammogram due to expense. Colonoscopy UTD.  Immunizations updated.   2.  Hypothyroidism: stable; obtain labs; refills provided. 3. Screening for DMII: obtain glucose, HgbA1c. 4.  Screening lipid: obtain FLP. 5.  Fatty liver non-alcoholic: stable; s/p GI  consultation with [facility] in the past five years; s/p Hepatitis A and B series with [facility]; call for records. Recommend weight loss, exercise, low-fat food choices. 6. Obesity: recommend weight loss, exercise, low-caloric food choices. 7. Anxiety and depression: improving; refill provided; reviewed with patient how to wean Citalopram over the next month; desires trial off of meds. 8.  S/p influenza vaccine. 9. Screening for HIV: obtain HIV. 10. Vaginal discharge: New.  Wet prep negative. 11.  R hearing loss; New. S/p audiometry; pt declined referral to ENT at this time.  Will consider in future.  Orders Placed This Encounter  Procedures  . Flu Vaccine QUAD 36+ mos IM  . CBC with Differential/Platelet  . Comprehensive metabolic panel    Order Specific Question:  Has the patient fasted?    Answer:  Yes  . Hemoglobin A1c  . Lipid panel    Order Specific Question:  Has the patient fasted?    Answer:  Yes  . T4, free  . TSH  . Vitamin B12  . Vit D  25 hydroxy (rtn osteoporosis monitoring)  . HIV antibody  . POCT urinalysis dipstick  . POCT Wet Prep with KOH   Meds ordered this encounter  Medications  . Cholecalciferol (VITAMIN D) 400 UNIT/ML LIQD    Sig: Take by mouth.  . Biotin 1 MG CAPS    Sig: Take by mouth.  . citalopram (CELEXA) 20 MG tablet    Sig: TAKE ONE (1) TABLET EACH DAY.    Dispense:  90 tablet    Refill:  3  . levothyroxine (SYNTHROID, LEVOTHROID) 88 MCG tablet    Sig: Take 1 tablet (88 mcg total) by mouth daily before breakfast.    Dispense:  90 tablet    Refill:  3    Return in about 1 year (around 11/13/2015) for complete physical examiniation.    Kristi Elayne Guerin, M.D. [facility] [address] [place], [place]  [phone] phone [phone] fax

## 2014-11-13 NOTE — Progress Notes (Signed)
   Subjective:    Patient ID: Christina Mcgee, female    DOB: May 13, 1950, 64 y.o.   MRN: [id number]  HPI    Review of Systems  Constitutional: Negative.   HENT: Positive for postnasal drip.   Eyes: Negative.   Respiratory: Negative.   Cardiovascular: Negative.   Gastrointestinal: Negative.   Endocrine: Negative.   Genitourinary: Negative.   Musculoskeletal: Negative.   Skin: Negative.   Allergic/Immunologic: Negative.   Neurological: Negative.   Hematological: Negative.   Psychiatric/Behavioral: Negative.        Objective:   Physical Exam        Assessment & Plan:

## 2014-11-14 DIAGNOSIS — E669 Obesity, unspecified: Secondary | ICD-10-CM | POA: Insufficient documentation

## 2014-11-14 DIAGNOSIS — K760 Fatty (change of) liver, not elsewhere classified: Secondary | ICD-10-CM | POA: Insufficient documentation

## 2014-11-14 DIAGNOSIS — F419 Anxiety disorder, unspecified: Secondary | ICD-10-CM

## 2014-11-14 DIAGNOSIS — F329 Major depressive disorder, single episode, unspecified: Secondary | ICD-10-CM | POA: Insufficient documentation

## 2014-11-14 DIAGNOSIS — E034 Atrophy of thyroid (acquired): Secondary | ICD-10-CM | POA: Insufficient documentation

## 2014-11-14 DIAGNOSIS — F32A Depression, unspecified: Secondary | ICD-10-CM | POA: Insufficient documentation

## 2014-11-14 LAB — HIV ANTIBODY (ROUTINE TESTING W REFLEX): HIV 1&2 Ab, 4th Generation: NONREACTIVE

## 2014-11-14 LAB — VITAMIN D 25 HYDROXY (VIT D DEFICIENCY, FRACTURES): VIT D 25 HYDROXY: 24 ng/mL — AB (ref 30–100)

## 2014-12-02 ENCOUNTER — Encounter: Payer: No Typology Code available for payment source | Admitting: Family Medicine

## 2014-12-04 ENCOUNTER — Encounter: Payer: No Typology Code available for payment source | Admitting: Family Medicine

## 2015-08-26 ENCOUNTER — Telehealth: Payer: Self-pay

## 2015-08-26 NOTE — Telephone Encounter (Signed)
Patient wants to know what pain medication she should take for her sciatica. Patient will be coming to see Dr. Tamala Julian when she get backs. Please advise! [phone]

## 2015-08-27 NOTE — Telephone Encounter (Signed)
Due to her history of elevated LFTs, she should use either ibuprofen 600 mg TID with meals or naproxen 440 mg BID.

## 2015-08-27 NOTE — Telephone Encounter (Signed)
Pt advised and understood. 

## 2015-08-27 NOTE — Telephone Encounter (Signed)
Called pt, she would like to know what to take over the counter. She has been taking Tylenol and naproxen. She would like to know how much she can take because she has history of elevated LFT's. Please advise.

## 2015-09-21 ENCOUNTER — Telehealth: Payer: Self-pay

## 2015-09-21 NOTE — Telephone Encounter (Signed)
Patient is requesting a CPE appointment with Dr. Tamala Julian after Sept 15, 2017.  No available at this time.    [phone]

## 2015-09-29 ENCOUNTER — Ambulatory Visit (INDEPENDENT_AMBULATORY_CARE_PROVIDER_SITE_OTHER): Payer: Medicare HMO

## 2015-09-29 ENCOUNTER — Encounter: Payer: Self-pay | Admitting: Family Medicine

## 2015-09-29 ENCOUNTER — Ambulatory Visit (INDEPENDENT_AMBULATORY_CARE_PROVIDER_SITE_OTHER): Payer: Medicare HMO | Admitting: Family Medicine

## 2015-09-29 VITALS — BP 120/78 | HR 73 | Temp 97.9°F | Resp 16 | Ht 65.0 in | Wt 201.2 lb

## 2015-09-29 DIAGNOSIS — M5136 Other intervertebral disc degeneration, lumbar region: Secondary | ICD-10-CM | POA: Diagnosis not present

## 2015-09-29 DIAGNOSIS — M5416 Radiculopathy, lumbar region: Secondary | ICD-10-CM | POA: Diagnosis not present

## 2015-09-29 DIAGNOSIS — IMO0001 Reserved for inherently not codable concepts without codable children: Secondary | ICD-10-CM

## 2015-09-29 MED ORDER — METHOCARBAMOL 500 MG PO TABS: 500.0000 mg | ORAL_TABLET | Freq: Four times a day (QID) | ORAL | Status: DC | PRN

## 2015-09-29 NOTE — Progress Notes (Signed)
Subjective:    Patient ID: Christina Mcgee, female    DOB: Jan 08, 1951, 65 y.o.   MRN: [id number]  09/29/2015  Back Pain (2 months ago, lower back pain that radiated to right leg, per pt sxs have improved greatly )   HPI This 65 y.o. female presents for evaluation of lower back pain with R sciatica.  Onset two weeks ago.  No injury.  Tylenol and Aleve without improvement.  Severe and has really improved.  Researched and has been performing exercises.  Severity 9/10 at its worst; now pain is 1/10.  Went to Touch Therapy with improvement; very helpful.  Relaxation techniques.  Releasing of pain.  June 2017 twice.  +R calf pain persistent.  Foot still feels tingling.  Normal bowel and bladder function.  No saddle paresthesias.     Review of Systems  Constitutional: Negative for fever, chills, diaphoresis and fatigue.  Eyes: Negative for visual disturbance.  Respiratory: Negative for cough and shortness of breath.   Cardiovascular: Negative for chest pain, palpitations and leg swelling.  Gastrointestinal: Negative for nausea, vomiting, abdominal pain, diarrhea and constipation.  Endocrine: Negative for cold intolerance, heat intolerance, polydipsia, polyphagia and polyuria.  Genitourinary: Negative for decreased urine volume and difficulty urinating.  Musculoskeletal: Positive for back pain and myalgias.  Neurological: Negative for dizziness, tremors, seizures, syncope, facial asymmetry, speech difficulty, weakness, light-headedness, numbness and headaches.    Past Medical History:  Diagnosis Date  . Anxiety   . Cervical dysplasia age 20  . Fatty liver disease, nonalcoholic    s/p GI consultation; [facility] in [place].  . Hypothyroid    Past Surgical History:  Procedure Laterality Date  . CESAREAN SECTION    . COLPOSCOPY    . GYNECOLOGIC CRYOSURGERY  age 60   No Known Allergies  Social History   Social History  . Marital status: Married    Spouse name: N/A  . Number of  children: N/A  . Years of education: N/A   Occupational History  . Not on file.   Social History Main Topics  . Smoking status: Former Research scientist (life sciences)  . Smokeless tobacco: Not on file  . Alcohol use Yes     Comment: Occas  . Drug use: No  . Sexual activity: Yes    Birth control/ protection: Post-menopausal   Other Topics Concern  . Not on file   Social History Narrative   Marital status: married x 40 years; happily married; no abuse       Children:  3 children (50, 6, 72); 4 grandchildren.       Lives: with husband, youngest son.      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      Tobacco; none       Alcohol:  Socially sporadically; once per week.      Exercise:  1-2 times per week at most.      Seatbelt:  100%      Guns: yes; unloaded.   Family History  Problem Relation Age of Onset  . Breast cancer Mother 14  . Leukemia Mother   . Cancer Mother     leukemia; breast cancer at age 35.  Marland Kitchen Hypertension Father   . Heart disease Father 71    CHF  . Hyperlipidemia Father   . Cancer Sister     Bladder  . Hyperlipidemia Brother   . Hypertension Brother        Objective:    BP  120/78   Pulse 73   Temp 97.9 F (36.6 C) (Oral)   Resp 16   Ht 5\' 5"  (1.651 m)   Wt 201 lb 3.2 oz (91.3 kg)   SpO2 98%   BMI 33.48 kg/m  Physical Exam  Constitutional: She is oriented to person, place, and time. She appears well-developed and well-nourished. No distress.  HENT:  Head: Normocephalic and atraumatic.  Eyes: Conjunctivae are normal. Pupils are equal, round, and reactive to light.  Neck: Normal range of motion. Neck supple.  Cardiovascular: Normal rate, regular rhythm and normal heart sounds.  Exam reveals no gallop and no friction rub.   No murmur heard. Pulmonary/Chest: Effort normal and breath sounds normal. She has no wheezes. She has no rales.  Musculoskeletal:       Right hip: Normal. She exhibits normal range of motion, normal strength, no  tenderness and no bony tenderness.       Left hip: Normal. She exhibits normal range of motion, normal strength, no tenderness and no bony tenderness.       Lumbar back: She exhibits pain and spasm. She exhibits normal range of motion, no tenderness, no bony tenderness and normal pulse.  Lumbar spine:  Non-tender midline; non-tender paraspinal regions B.  Straight leg raises negative B; toe and heel walking intact; marching intact; motor 5/5 BLE.  Full ROM lumbar spine without limitation.   Neurological: She is alert and oriented to person, place, and time.  Skin: She is not diaphoretic.  Psychiatric: She has a normal mood and affect. Her behavior is normal.  Nursing note and vitals reviewed.       Assessment & Plan:   1. Radicular pain of right lower back    -New. -rx for Robaxin provided. -home exercise program provided to perform daily. -call in two weeks if no improvement and will refer to ortho and/or PT.   Orders Placed This Encounter  Procedures  . DG Lumbar Spine Complete    Standing Status:   Future    Number of Occurrences:   1    Standing Expiration Date:   09/28/2016    Order Specific Question:   Reason for Exam (SYMPTOM  OR DIAGNOSIS REQUIRED)    Answer:   lower back pain with R radicular pain    Order Specific Question:   Preferred imaging location?    Answer:   External   Meds ordered this encounter  Medications  . methocarbamol (ROBAXIN) 500 MG tablet    Sig: Take 1 tablet (500 mg total) by mouth every 6 (six) hours as needed for muscle spasms.    Dispense:  45 tablet    Refill:  0    No Follow-up on file.    Pierra Skora Elayne Guerin, M.D. [facility] [address] [place], [place]  [address] [phone] phone [phone] fax

## 2015-09-29 NOTE — Patient Instructions (Addendum)
IF you received an x-ray today, you will receive an invoice from [facility]. Please contact [facility] at [phone] with questions or concerns regarding your invoice.   IF you received labwork today, you will receive an invoice from [employer]. Please contact [facility] at [phone] with questions or concerns regarding your invoice.   Our billing staff will not be able to assist you with questions regarding bills from these companies.  You will be contacted with the lab results as soon as they are available. The fastest way to get your results is to activate your My Chart account. Instructions are located on the last page of this paperwork. If you have not heard from [place] regarding the results in 2 weeks, please contact this office.     We recommend that you schedule a mammogram for breast cancer screening. Typically, you do not need a referral to do this. Please contact a local imaging center to schedule your mammogram.  [facility] - [phone]  *ask for the Radiology [place]) - [phone] or [phone]  [facility] - [phone] [place] [phone] [facility] - [phone]  *ask for the [facility] - [phone]  *ask for the Radiology Department [facility] - [phone]  *ask for the Mammography Department [facility] - [phone]  Sciatica With Rehab The sciatic nerve runs from the back down the leg and is responsible for sensation and control of the muscles in the back (posterior) side of the thigh, lower leg, and foot. Sciatica is a condition that is characterized by inflammation of this nerve.  SYMPTOMS   Signs of nerve damage, including numbness and/or weakness along the posterior side of the lower extremity.  Pain in the back of the thigh that may also  travel down the leg.  Pain that worsens when sitting for long periods of time.  Occasionally, pain in the back or buttock. CAUSES  Inflammation of the sciatic nerve is the cause of sciatica. The inflammation is due to something irritating the nerve. Common sources of irritation include:  Sitting for long periods of time.  Direct trauma to the nerve.  Arthritis of the spine.  Herniated or ruptured disk.  Slipping of the vertebrae (spondylolisthesis).  Pressure from soft tissues, such as muscles or ligament-like tissue (fascia). RISK INCREASES WITH:  Sports that place pressure or stress on the spine (football or weightlifting).  Poor strength and flexibility.  Failure to warm up properly before activity.  Family history of low back pain or disk disorders.  Previous back injury or surgery.  Poor body mechanics, especially when lifting, or poor posture. PREVENTION   Warm up and stretch properly before activity.  Maintain physical fitness:  Strength, flexibility, and endurance.  Cardiovascular fitness.  Learn and use proper technique, especially with posture and lifting. When possible, have coach correct improper technique.  Avoid activities that place stress on the spine. PROGNOSIS If treated properly, then sciatica usually resolves within 6 weeks. However, occasionally surgery is necessary.  RELATED COMPLICATIONS   Permanent nerve damage, including pain, numbness, tingle, or weakness.  Chronic back pain.  Risks of surgery: infection, bleeding, nerve damage, or damage to surrounding tissues. TREATMENT Treatment initially involves resting from any activities that aggravate your symptoms. The use of ice and medication may help reduce pain and inflammation. The use of strengthening and stretching exercises may help  reduce pain with activity. These exercises may be performed at home or with referral to a therapist. A therapist may recommend further treatments, such as  transcutaneous electronic nerve stimulation (TENS) or ultrasound. Your caregiver may recommend corticosteroid injections to help reduce inflammation of the sciatic nerve. If symptoms persist despite non-surgical (conservative) treatment, then surgery may be recommended. MEDICATION  If pain medication is necessary, then nonsteroidal anti-inflammatory medications, such as aspirin and ibuprofen, or other minor pain relievers, such as acetaminophen, are often recommended.  Do not take pain medication for 7 days before surgery.  Prescription pain relievers may be given if deemed necessary by your caregiver. Use only as directed and only as much as you need.  Ointments applied to the skin may be helpful.  Corticosteroid injections may be given by your caregiver. These injections should be reserved for the most serious cases, because they may only be given a certain number of times. HEAT AND COLD  Cold treatment (icing) relieves pain and reduces inflammation. Cold treatment should be applied for 10 to 15 minutes every 2 to 3 hours for inflammation and pain and immediately after any activity that aggravates your symptoms. Use ice packs or massage the area with a piece of ice (ice massage).  Heat treatment may be used prior to performing the stretching and strengthening activities prescribed by your caregiver, physical therapist, or athletic trainer. Use a heat Seretha Estabrooks or soak the injury in warm water. SEEK MEDICAL CARE IF:  Treatment seems to offer no benefit, or the condition worsens.  Any medications produce adverse side effects. EXERCISES  RANGE OF MOTION (ROM) AND STRETCHING EXERCISES - Sciatica Most people with sciatic will find that their symptoms worsen with either excessive bending forward (flexion) or arching at the low back (extension). The exercises which will help resolve your symptoms will focus on the opposite motion. Your physician, physical therapist or athletic trainer will help you  determine which exercises will be most helpful to resolve your low back pain. Do not complete any exercises without first consulting with your clinician. Discontinue any exercises which worsen your symptoms until you speak to your clinician. If you have pain, numbness or tingling which travels down into your buttocks, leg or foot, the goal of the therapy is for these symptoms to move closer to your back and eventually resolve. Occasionally, these leg symptoms will get better, but your low back pain may worsen; this is typically an indication of progress in your rehabilitation. Be certain to be very alert to any changes in your symptoms and the activities in which you participated in the 24 hours prior to the change. Sharing this information with your clinician will allow him/her to most efficiently treat your condition. These exercises may help you when beginning to rehabilitate your injury. Your symptoms may resolve with or without further involvement from your physician, physical therapist or athletic trainer. While completing these exercises, remember:   Restoring tissue flexibility helps normal motion to return to the joints. This allows healthier, less painful movement and activity.  An effective stretch should be held for at least 30 seconds.  A stretch should never be painful. You should only feel a gentle lengthening or release in the stretched tissue. FLEXION RANGE OF MOTION AND STRETCHING EXERCISES: STRETCH - Flexion, Single Knee to Chest   Lie on a firm bed or floor with both legs extended in front of you.  Keeping one leg in contact with the floor, bring your opposite knee to your chest. Hold  your leg in place by either grabbing behind your thigh or at your knee.  Pull until you feel a gentle stretch in your low back. Hold __________ seconds.  Slowly release your grasp and repeat the exercise with the opposite side. Repeat __________ times. Complete this exercise __________ times per  day.  STRETCH - Flexion, Double Knee to Chest  Lie on a firm bed or floor with both legs extended in front of you.  Keeping one leg in contact with the floor, bring your opposite knee to your chest.  Tense your stomach muscles to support your back and then lift your other knee to your chest. Hold your legs in place by either grabbing behind your thighs or at your knees.  Pull both knees toward your chest until you feel a gentle stretch in your low back. Hold __________ seconds.  Tense your stomach muscles and slowly return one leg at a time to the floor. Repeat __________ times. Complete this exercise __________ times per day.  STRETCH - Low Trunk Rotation   Lie on a firm bed or floor. Keeping your legs in front of you, bend your knees so they are both pointed toward the ceiling and your feet are flat on the floor.  Extend your arms out to the side. This will stabilize your upper body by keeping your shoulders in contact with the floor.  Gently and slowly drop both knees together to one side until you feel a gentle stretch in your low back. Hold for __________ seconds.  Tense your stomach muscles to support your low back as you bring your knees back to the starting position. Repeat the exercise to the other side. Repeat __________ times. Complete this exercise __________ times per day  EXTENSION RANGE OF MOTION AND FLEXIBILITY EXERCISES: STRETCH - Extension, Prone on Elbows  Lie on your stomach on the floor, a bed will be too soft. Place your palms about shoulder width apart and at the height of your head.  Place your elbows under your shoulders. If this is too painful, stack pillows under your chest.  Allow your body to relax so that your hips drop lower and make contact more completely with the floor.  Hold this position for __________ seconds.  Slowly return to lying flat on the floor. Repeat __________ times. Complete this exercise __________ times per day.  RANGE OF MOTION -  Extension, Prone Press Ups  Lie on your stomach on the floor, a bed will be too soft. Place your palms about shoulder width apart and at the height of your head.  Keeping your back as relaxed as possible, slowly straighten your elbows while keeping your hips on the floor. You may adjust the placement of your hands to maximize your comfort. As you gain motion, your hands will come more underneath your shoulders.  Hold this position __________ seconds.  Slowly return to lying flat on the floor. Repeat __________ times. Complete this exercise __________ times per day.  STRENGTHENING EXERCISES - Sciatica  These exercises may help you when beginning to rehabilitate your injury. These exercises should be done near your "sweet spot." This is the neutral, low-back arch, somewhere between fully rounded and fully arched, that is your least painful position. When performed in this safe range of motion, these exercises can be used for people who have either a flexion or extension based injury. These exercises may resolve your symptoms with or without further involvement from your physician, physical therapist or athletic trainer. While completing these  exercises, remember:   Muscles can gain both the endurance and the strength needed for everyday activities through controlled exercises.  Complete these exercises as instructed by your physician, physical therapist or athletic trainer. Progress with the resistance and repetition exercises only as your caregiver advises.  You may experience muscle soreness or fatigue, but the pain or discomfort you are trying to eliminate should never worsen during these exercises. If this pain does worsen, stop and make certain you are following the directions exactly. If the pain is still present after adjustments, discontinue the exercise until you can discuss the trouble with your clinician. STRENGTHENING - Deep Abdominals, Pelvic Tilt   Lie on a firm bed or floor. Keeping  your legs in front of you, bend your knees so they are both pointed toward the ceiling and your feet are flat on the floor.  Tense your lower abdominal muscles to press your low back into the floor. This motion will rotate your pelvis so that your tail bone is scooping upwards rather than pointing at your feet or into the floor.  With a gentle tension and even breathing, hold this position for __________ seconds. Repeat __________ times. Complete this exercise __________ times per day.  STRENGTHENING - Abdominals, Crunches   Lie on a firm bed or floor. Keeping your legs in front of you, bend your knees so they are both pointed toward the ceiling and your feet are flat on the floor. Cross your arms over your chest.  Slightly tip your chin down without bending your neck.  Tense your abdominals and slowly lift your trunk high enough to just clear your shoulder blades. Lifting higher can put excessive stress on the low back and does not further strengthen your abdominal muscles.  Control your return to the starting position. Repeat __________ times. Complete this exercise __________ times per day.  STRENGTHENING - Quadruped, Opposite UE/LE Lift  Assume a hands and knees position on a firm surface. Keep your hands under your shoulders and your knees under your hips. You may place padding under your knees for comfort.  Find your neutral spine and gently tense your abdominal muscles so that you can maintain this position. Your shoulders and hips should form a rectangle that is parallel with the floor and is not twisted.  Keeping your trunk steady, lift your right hand no higher than your shoulder and then your left leg no higher than your hip. Make sure you are not holding your breath. Hold this position __________ seconds.  Continuing to keep your abdominal muscles tense and your back steady, slowly return to your starting position. Repeat with the opposite arm and leg. Repeat __________ times.  Complete this exercise __________ times per day.  STRENGTHENING - Abdominals and Quadriceps, Straight Leg Raise   Lie on a firm bed or floor with both legs extended in front of you.  Keeping one leg in contact with the floor, bend the other knee so that your foot can rest flat on the floor.  Find your neutral spine, and tense your abdominal muscles to maintain your spinal position throughout the exercise.  Slowly lift your straight leg off the floor about 6 inches for a count of 15, making sure to not hold your breath.  Still keeping your neutral spine, slowly lower your leg all the way to the floor. Repeat this exercise with each leg __________ times. Complete this exercise __________ times per day. POSTURE AND BODY MECHANICS CONSIDERATIONS - Sciatica Keeping correct posture when sitting,  standing or completing your activities will reduce the stress put on different body tissues, allowing injured tissues a chance to heal and limiting painful experiences. The following are general guidelines for improved posture. Your physician or physical therapist will provide you with any instructions specific to your needs. While reading these guidelines, remember:  The exercises prescribed by your provider will help you have the flexibility and strength to maintain correct postures.  The correct posture provides the optimal environment for your joints to work. All of your joints have less wear and tear when properly supported by a spine with good posture. This means you will experience a healthier, less painful body.  Correct posture must be practiced with all of your activities, especially prolonged sitting and standing. Correct posture is as important when doing repetitive low-stress activities (typing) as it is when doing a single heavy-load activity (lifting). RESTING POSITIONS Consider which positions are most painful for you when choosing a resting position. If you have pain with flexion-based  activities (sitting, bending, stooping, squatting), choose a position that allows you to rest in a less flexed posture. You would want to avoid curling into a fetal position on your side. If your pain worsens with extension-based activities (prolonged standing, working overhead), avoid resting in an extended position such as sleeping on your stomach. Most people will find more comfort when they rest with their spine in a more neutral position, neither too rounded nor too arched. Lying on a non-sagging bed on your side with a pillow between your knees, or on your back with a pillow under your knees will often provide some relief. Keep in mind, being in any one position for a prolonged period of time, no matter how correct your posture, can still lead to stiffness. PROPER SITTING POSTURE In order to minimize stress and discomfort on your spine, you must sit with correct posture Sitting with good posture should be effortless for a healthy body. Returning to good posture is a gradual process. Many people can work toward this most comfortably by using various supports until they have the flexibility and strength to maintain this posture on their own. When sitting with proper posture, your ears will fall over your shoulders and your shoulders will fall over your hips. You should use the back of the chair to support your upper back. Your low back will be in a neutral position, just slightly arched. You may place a small pillow or folded towel at the base of your low back for support.  When working at a desk, create an environment that supports good, upright posture. Without extra support, muscles fatigue and lead to excessive strain on joints and other tissues. Keep these recommendations in mind: CHAIR:   A chair should be able to slide under your desk when your back makes contact with the back of the chair. This allows you to work closely.  The chair's height should allow your eyes to be level with the upper part  of your monitor and your hands to be slightly lower than your elbows. BODY POSITION  Your feet should make contact with the floor. If this is not possible, use a foot rest.  Keep your ears over your shoulders. This will reduce stress on your neck and low back. INCORRECT SITTING POSTURES   If you are feeling tired and unable to assume a healthy sitting posture, do not slouch or slump. This puts excessive strain on your back tissues, causing more damage and pain. Healthier options include:  Using more support, like a lumbar pillow.  Switching tasks to something that requires you to be upright or walking.  Talking a brief walk.  Lying down to rest in a neutral-spine position. PROLONGED STANDING WHILE SLIGHTLY LEANING FORWARD  When completing a task that requires you to lean forward while standing in one place for a long time, place either foot up on a stationary 2-4 inch high object to help maintain the best posture. When both feet are on the ground, the low back tends to lose its slight inward curve. If this curve flattens (or becomes too large), then the back and your other joints will experience too much stress, fatigue more quickly and can cause pain.  CORRECT STANDING POSTURES Proper standing posture should be assumed with all daily activities, even if they only take a few moments, like when brushing your teeth. As in sitting, your ears should fall over your shoulders and your shoulders should fall over your hips. You should keep a slight tension in your abdominal muscles to brace your spine. Your tailbone should point down to the ground, not behind your body, resulting in an over-extended swayback posture.  INCORRECT STANDING POSTURES  Common incorrect standing postures include a forward head, locked knees and/or an excessive swayback. WALKING Walk with an upright posture. Your ears, shoulders and hips should all line-up. PROLONGED ACTIVITY IN A FLEXED POSITION When completing a task  that requires you to bend forward at your waist or lean over a low surface, try to find a way to stabilize 3 of 4 of your limbs. You can place a hand or elbow on your thigh or rest a knee on the surface you are reaching across. This will provide you more stability so that your muscles do not fatigue as quickly. By keeping your knees relaxed, or slightly bent, you will also reduce stress across your low back. CORRECT LIFTING TECHNIQUES DO :   Assume a wide stance. This will provide you more stability and the opportunity to get as close as possible to the object which you are lifting.  Tense your abdominals to brace your spine; then bend at the knees and hips. Keeping your back locked in a neutral-spine position, lift using your leg muscles. Lift with your legs, keeping your back straight.  Test the weight of unknown objects before attempting to lift them.  Try to keep your elbows locked down at your sides in order get the best strength from your shoulders when carrying an object.  Always ask for help when lifting heavy or awkward objects. INCORRECT LIFTING TECHNIQUES DO NOT:   Lock your knees when lifting, even if it is a small object.  Bend and twist. Pivot at your feet or move your feet when needing to change directions.  Assume that you cannot safely pick up a paperclip without proper posture.   This information is not intended to replace advice given to you by your health care provider. Make sure you discuss any questions you have with your health care provider.   Document Released: 02/27/2005 Document Revised: 07/14/2014 Document Reviewed: 06/11/2008 Elsevier Interactive Patient Education [employer].

## 2015-10-05 NOTE — Telephone Encounter (Signed)
Added pt to wait list for Dr. Tamala Julian

## 2015-11-17 ENCOUNTER — Encounter: Payer: Medicare HMO | Admitting: Family Medicine

## 2016-01-03 ENCOUNTER — Other Ambulatory Visit: Payer: Self-pay | Admitting: Family Medicine

## 2016-01-03 DIAGNOSIS — E034 Atrophy of thyroid (acquired): Secondary | ICD-10-CM

## 2016-01-03 DIAGNOSIS — F329 Major depressive disorder, single episode, unspecified: Secondary | ICD-10-CM

## 2016-01-03 DIAGNOSIS — F32A Depression, unspecified: Secondary | ICD-10-CM

## 2016-01-03 DIAGNOSIS — F419 Anxiety disorder, unspecified: Principal | ICD-10-CM

## 2016-02-02 ENCOUNTER — Encounter: Payer: Medicare HMO | Admitting: Family Medicine

## 2016-02-07 ENCOUNTER — Other Ambulatory Visit: Payer: Self-pay | Admitting: Family Medicine

## 2016-02-07 DIAGNOSIS — E034 Atrophy of thyroid (acquired): Secondary | ICD-10-CM

## 2016-02-07 DIAGNOSIS — F32A Depression, unspecified: Secondary | ICD-10-CM

## 2016-02-07 DIAGNOSIS — F329 Major depressive disorder, single episode, unspecified: Secondary | ICD-10-CM

## 2016-02-07 DIAGNOSIS — F419 Anxiety disorder, unspecified: Secondary | ICD-10-CM

## 2016-02-09 NOTE — Telephone Encounter (Signed)
Last ov 11/2014 and nl tshnext appt. 02/16/16

## 2016-02-16 ENCOUNTER — Ambulatory Visit (INDEPENDENT_AMBULATORY_CARE_PROVIDER_SITE_OTHER): Payer: Medicare HMO | Admitting: Family Medicine

## 2016-02-16 ENCOUNTER — Encounter: Payer: Self-pay | Admitting: Family Medicine

## 2016-02-16 VITALS — BP 130/60 | HR 63 | Temp 97.8°F | Resp 16 | Ht 65.0 in | Wt 204.6 lb

## 2016-02-16 DIAGNOSIS — Z6834 Body mass index (BMI) 34.0-34.9, adult: Secondary | ICD-10-CM

## 2016-02-16 DIAGNOSIS — F418 Other specified anxiety disorders: Secondary | ICD-10-CM | POA: Diagnosis not present

## 2016-02-16 DIAGNOSIS — R69 Illness, unspecified: Secondary | ICD-10-CM | POA: Diagnosis not present

## 2016-02-16 DIAGNOSIS — Z1211 Encounter for screening for malignant neoplasm of colon: Secondary | ICD-10-CM

## 2016-02-16 DIAGNOSIS — Z1231 Encounter for screening mammogram for malignant neoplasm of breast: Secondary | ICD-10-CM

## 2016-02-16 DIAGNOSIS — N649 Disorder of breast, unspecified: Secondary | ICD-10-CM

## 2016-02-16 DIAGNOSIS — L989 Disorder of the skin and subcutaneous tissue, unspecified: Secondary | ICD-10-CM

## 2016-02-16 DIAGNOSIS — E2839 Other primary ovarian failure: Secondary | ICD-10-CM

## 2016-02-16 DIAGNOSIS — Z13220 Encounter for screening for lipoid disorders: Secondary | ICD-10-CM | POA: Diagnosis not present

## 2016-02-16 DIAGNOSIS — Z0000 Encounter for general adult medical examination without abnormal findings: Secondary | ICD-10-CM

## 2016-02-16 DIAGNOSIS — E6609 Other obesity due to excess calories: Secondary | ICD-10-CM

## 2016-02-16 DIAGNOSIS — K760 Fatty (change of) liver, not elsewhere classified: Secondary | ICD-10-CM

## 2016-02-16 DIAGNOSIS — F32A Depression, unspecified: Secondary | ICD-10-CM

## 2016-02-16 DIAGNOSIS — Z23 Encounter for immunization: Secondary | ICD-10-CM

## 2016-02-16 DIAGNOSIS — E039 Hypothyroidism, unspecified: Secondary | ICD-10-CM | POA: Diagnosis not present

## 2016-02-16 DIAGNOSIS — Z131 Encounter for screening for diabetes mellitus: Secondary | ICD-10-CM | POA: Diagnosis not present

## 2016-02-16 DIAGNOSIS — E034 Atrophy of thyroid (acquired): Secondary | ICD-10-CM | POA: Diagnosis not present

## 2016-02-16 DIAGNOSIS — E66811 Obesity, class 1: Secondary | ICD-10-CM

## 2016-02-16 DIAGNOSIS — F329 Major depressive disorder, single episode, unspecified: Secondary | ICD-10-CM

## 2016-02-16 DIAGNOSIS — E559 Vitamin D deficiency, unspecified: Secondary | ICD-10-CM | POA: Diagnosis not present

## 2016-02-16 DIAGNOSIS — F419 Anxiety disorder, unspecified: Secondary | ICD-10-CM

## 2016-02-16 LAB — POCT URINALYSIS DIP (MANUAL ENTRY)
BILIRUBIN UA: NEGATIVE
BILIRUBIN UA: NEGATIVE
Blood, UA: NEGATIVE
Glucose, UA: NEGATIVE
Nitrite, UA: NEGATIVE
Protein Ur, POC: NEGATIVE
Spec Grav, UA: 1.015
Urobilinogen, UA: 0.2
pH, UA: 7.5

## 2016-02-16 MED ORDER — LEVOTHYROXINE SODIUM 88 MCG PO TABS: ORAL_TABLET | ORAL | 3 refills | Status: DC

## 2016-02-16 MED ORDER — CITALOPRAM HYDROBROMIDE 20 MG PO TABS: 20.0000 mg | ORAL_TABLET | Freq: Every day | ORAL | 3 refills | Status: DC

## 2016-02-16 NOTE — Patient Instructions (Addendum)
IF you received an x-ray today, you will receive an invoice from [facility]. Please contact [facility] at [phone] with questions or concerns regarding your invoice.   IF you received labwork today, you will receive an invoice from [employer]. Please contact [facility] at [phone] with questions or concerns regarding your invoice.   Our billing staff will not be able to assist you with questions regarding bills from these companies.  You will be contacted with the lab results as soon as they are available. The fastest way to get your results is to activate your My Chart account. Instructions are located on the last page of this paperwork. If you have not heard from [place] regarding the results in 2 weeks, please contact this office.    We recommend that you schedule a mammogram for breast cancer screening. Typically, you do not need a referral to do this. Please contact a local imaging center to schedule your mammogram.  [facility] - [phone]  *ask for the Radiology [place] - [phone] or [phone]  [facility] - [phone] [place] [phone] [facility]  - [phone]  *ask for the [facility] - [phone]  *ask for the Radiology Department [facility] - [phone]  *ask for the [place] - [phone]  Advance Directive Advance directives are the legal documents that allow you to make choices about your health care and medical treatment if you cannot speak for yourself. Advance directives are a way for you to communicate your wishes to family, friends, and health care providers. The specified people can then convey your decisions about end-of-life care to avoid confusion if you should become unable to communicate. Ideally, the process of  discussing and writing advance directives should happen over time rather than making decisions all at once. Advance directives can be modified as your situation changes, and you can change your mind at any time, even after you have signed the advance directives. Each state has its own laws regarding advance directives. You may want to check with your health care provider, attorney, or state representative about the law in your state. Below are some examples of advance directives. HEALTH CARE PROXY AND DURABLE POWER OF ATTORNEY FOR HEALTH CARE A health care proxy is a person (agent) appointed to make medical decisions for you if you cannot. Generally, people choose someone they know well and trust to represent their preferences when they can no longer do so. You should be sure to ask this person for agreement to act as your agent. An agent may have to exercise judgment in the event of a medical decision for which your wishes are not known. A durable power of attorney for health care is a legal document that names your health care proxy. Depending on the laws in your state, after the document is written, it may also need to be:  Signed.  Notarized.  Dated.  Copied.  Witnessed.  Incorporated into your medical record. You may also want to appoint someone to manage your financial affairs if you cannot. This is called a durable power of attorney for finances. It is a separate legal document from the durable power of attorney for health care. You may choose the same person or someone different from your health care proxy to act as your agent in financial matters. LIVING WILL A living will is  a set of instructions documenting your wishes about medical care when you cannot care for yourself. It is used if you become:  Terminally ill.  Incapacitated.  Unable to communicate.  Unable to make decisions. Items to consider in your living will include:  The use or non-use of life-sustaining equipment,  such as dialysis machines and breathing machines (ventilators).  A do not resuscitate (DNR) order, which is the instruction not to use cardiopulmonary resuscitation (CPR) if breathing or heartbeat stops.  Tube feeding.  Withholding of food and fluids.  Comfort (palliative) care when the goal becomes comfort rather than a cure.  Organ and tissue donation. A living will does not give instructions about distribution of your money and property if you should pass away. It is advisable to seek the expert advice of a lawyer in drawing up a will regarding your possessions. Decisions about taxes, beneficiaries, and asset distribution will be legally binding. This process can relieve your family and friends of any burdens surrounding disputes or questions that may come up about the allocation of your assets. DO NOT RESUSCITATE (DNR) A do not resuscitate (DNR) order is a request to not have CPR in the event that your heart stops beating or you stop breathing. Unless given other instructions, a health care provider will try to help any patient whose heart has stopped or who has stopped breathing.  This information is not intended to replace advice given to you by your health care provider. Make sure you discuss any questions you have with your health care provider. Document Released: 06/06/2007 Document Revised: 06/21/2015 Document Reviewed: 07/17/2012 Elsevier Interactive Patient Education  2017 [place] [employer]  Get These Tests  Blood Pressure- Have your blood pressure checked by your healthcare provider at least once a year.  Normal blood pressure is 120/80.  Weight- Have your body mass index (BMI) calculated to screen for obesity.  BMI is a measure of body fat based on height and weight.  You can calculate your own BMI at [employer]velB[employer].it  Cholesterol- Have your cholesterol checked every year.  Diabetes- Have your blood sugar checked every year if you have high blood  pressure, high cholesterol, a family history of diabetes or if you are overweight.  Pap Test - Have a pap test every 1 to 5 years if you have been sexually active.  If you are older than 65 and recent pap tests have been normal you may not need additional pap tests.  In addition, if you have had a hysterectomy  for benign disease additional pap tests are not necessary.  Mammogram-Yearly mammograms are essential for early detection of breast cancer  Screening for Colon Cancer- Colonoscopy starting at age 56. Screening may begin sooner depending on your family history and other health conditions.  Follow up colonoscopy as directed by your Gastroenterologist.  Screening for Osteoporosis- Screening begins at age 75 with bone density scanning, sooner if you are at higher risk for developing Osteoporosis.  Get these medicines  Calcium with Vitamin D- Your body requires 1200-1500 mg of Calcium a day and [phone] IU of Vitamin D a day.  You can only absorb 500 mg of Calcium at a time therefore Calcium must be taken in 2 or 3 separate doses throughout the day.  Hormones- Hormone therapy has been associated with increased risk for certain cancers and heart disease.  Talk to your healthcare provider about if you need relief from menopausal symptoms.  Aspirin- Ask your healthcare provider about taking Aspirin  to prevent Heart Disease and Stroke.  Get these Immuniztions  Flu shot- Every fall  Pneumonia shot- Once after the age of 45; if you are younger ask your healthcare provider if you need a pneumonia shot.  Tetanus- Every ten years.  Zostavax- Once after the age of 22 to prevent shingles.  Take these steps  Don't smoke- Your healthcare provider can help you quit. For tips on how to quit, ask your healthcare provider or go to www.smokefree.gov or call 1-800 QUIT-NOW.  Be physically active- Exercise 5 days a week for a minimum of 30 minutes.  If you are not already physically active, start slow  and gradually work up to 30 minutes of moderate physical activity.  Try walking, dancing, bike riding, swimming, etc.  Eat a healthy diet- Eat a variety of healthy foods such as fruits, vegetables, whole grains, low fat milk, low fat cheeses, yogurt, lean meats, chicken, fish, eggs, dried beans, tofu, etc.  For more information go to www.thenutritionsource.org  Dental visit- Brush and floss teeth twice daily; visit your dentist twice a year.  Eye exam- Visit your Optometrist or Ophthalmologist yearly.  Drink alcohol in moderation- Limit alcohol intake to one drink or less a day.  Never drink and drive.  Depression- Your emotional health is as important as your physical health.  If you're feeling down or losing interest in things you normally enjoy, please talk to your healthcare provider.  Seat Belts- can save your life; always wear one  Smoke/Carbon Monoxide detectors- These detectors need to be installed on the appropriate level of your home.  Replace batteries at least once a year.  Violence- If anyone is threatening or hurting you, please tell your healthcare provider. Living Will/ Health care power of attorney- Discuss with your healthcare provider and family.

## 2016-02-16 NOTE — Progress Notes (Signed)
Subjective:    Patient ID: Christina Mcgee, female    DOB: December 13, 1950, 65 y.o.   MRN: [id number]  02/16/2016  Annual Exam and Medication Refill (CELEXA and SYNTHROID)   HPI This 65 y.o. female presents for Complete Physical Examination.  Last physical:11-13-2014 Pap smear: 2015 WNL HPV negative.  Mammogram: 06-2012; money issue in the past. Colonoscopy: over ten years. Bone density: 2009 Eye exam:  Glasses. Dental exam:  Every six months.  Itching on L areola for two years; raised skin with hyperpigmentation.  Mole in R lateral neck.  Hypothyroidism: Patient reports good compliance with medication, good tolerance to medication, and good symptom control.    Anxiety and depression: Patient reports good compliance with medication, good tolerance to medication, and good symptom control.     Immunization History  Administered Date(s) Administered  . Influenza,inj,Quad PF,36+ Mos 11/13/2014, 02/16/2016  . Pneumococcal Conjugate-13 02/16/2016   BP Readings from Last 3 Encounters:  02/16/16 130/60  09/29/15 120/78  11/13/14 111/67   Wt Readings from Last 3 Encounters:  02/16/16 204 lb 9.6 oz (92.8 kg)  09/29/15 201 lb 3.2 oz (91.3 kg)  11/13/14 204 lb 3.2 oz (92.6 kg)     Review of Systems  Constitutional: Negative for activity change, appetite change, chills, diaphoresis, fatigue, fever and unexpected weight change.  HENT: Negative for congestion, dental problem, drooling, ear discharge, ear pain, facial swelling, hearing loss, mouth sores, nosebleeds, postnasal drip, rhinorrhea, sinus pressure, sneezing, sore throat, tinnitus, trouble swallowing and voice change.   Eyes: Negative for photophobia, pain, discharge, redness, itching and visual disturbance.  Respiratory: Negative for apnea, cough, choking, chest tightness, shortness of breath, wheezing and stridor.   Cardiovascular: Negative for chest pain, palpitations and leg swelling.  Gastrointestinal: Negative for abdominal  distention, abdominal pain, anal bleeding, blood in stool, constipation, diarrhea, nausea, rectal pain and vomiting.  Endocrine: Negative for cold intolerance, heat intolerance, polydipsia, polyphagia and polyuria.  Genitourinary: Negative for decreased urine volume, difficulty urinating, dyspareunia, dysuria, enuresis, flank pain, frequency, genital sores, hematuria, menstrual problem, pelvic pain, urgency, vaginal bleeding, vaginal discharge and vaginal pain.       Nocturia x 0.  Urge incontinence with waiting.  Musculoskeletal: Negative for arthralgias, back pain, gait problem, joint swelling, myalgias, neck pain and neck stiffness.  Skin: Negative for pallor, rash and wound.  Allergic/Immunologic: Negative for environmental allergies, food allergies and immunocompromised state.  Neurological: Negative for dizziness, tremors, seizures, syncope, facial asymmetry, speech difficulty, weakness, light-headedness, numbness and headaches.  Hematological: Negative for adenopathy. Does not bruise/bleed easily.  Psychiatric/Behavioral: Negative for agitation, behavioral problems, confusion, decreased concentration, dysphoric mood, hallucinations, self-injury, sleep disturbance and suicidal ideas. The patient is not nervous/anxious and is not hyperactive.        Bedtime 10:00pm; wakes up 6:30am.    Past Medical History:  Diagnosis Date  . Allergy   . Anxiety   . Cervical dysplasia age 51  . Fatty liver disease, nonalcoholic    s/p GI consultation; [facility] in [place].  . Hypothyroid    Past Surgical History:  Procedure Laterality Date  . CESAREAN SECTION    . COLPOSCOPY    . GYNECOLOGIC CRYOSURGERY  age 65   No Known Allergies Current Outpatient Prescriptions  Medication Sig Dispense Refill  . citalopram (CELEXA) 20 MG tablet Take 1 tablet (20 mg total) by mouth daily. 90 tablet 3  . levothyroxine (SYNTHROID, LEVOTHROID) 88 MCG tablet TAKE 1 TABLET BY MOUTH EVERY MORNING BEFORE BREAKFAST  90 tablet 3  .  Cholecalciferol (VITAMIN D) 400 UNIT/ML LIQD Take by mouth.    . methocarbamol (ROBAXIN) 500 MG tablet Take 1 tablet (500 mg total) by mouth every 6 (six) hours as needed for muscle spasms. (Patient not taking: Reported on 02/16/2016) 45 tablet 0   No current facility-administered medications for this visit.    Social History   Social History  . Marital status: Married    Spouse name: N/A  . Number of children: N/A  . Years of education: N/A   Occupational History  . Not on file.   Social History Main Topics  . Smoking status: Former Research scientist (life sciences)  . Smokeless tobacco: Never Used  . Alcohol use Yes     Comment: Occas  . Drug use: No  . Sexual activity: Yes    Birth control/ protection: Post-menopausal   Other Topics Concern  . Not on file   Social History Narrative   Marital status: married x 41 years; happily married; no abuse       Children:  3 children (37, 54, 61); 4 grandchildren in [place] within 30 minutes.       Lives: with husband, youngest son.      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 Wprking 2 days per week; [employer] scattered.      Tobacco; none       Alcohol:  Socially sporadically; once per week.      Exercise:  None in 2017      Seatbelt:  100%      Guns: yes; unloaded.      Advanced Directives: FULL CODE.  YES.    Family History  Problem Relation Age of Onset  . Breast cancer Mother 74  . Leukemia Mother   . Cancer Mother     leukemia; breast cancer at age 38.  Marland Kitchen Hypertension Father   . Heart disease Father 41    CHF  . Hyperlipidemia Father   . Cancer Sister     Bladder  . Hyperlipidemia Brother   . Hypertension Brother   . Cancer Maternal Grandmother     BREAST  . Heart disease Maternal Grandfather   . Hyperlipidemia Maternal Grandfather   . Hypertension Maternal Grandfather   . Cancer Paternal Grandmother     BREAST       Objective:    BP 130/60 (BP Location: Left Arm, Patient Position:  Sitting, Cuff Size: Large)   Pulse 63   Temp 97.8 F (36.6 C) (Oral)   Resp 16   Ht 5\' 5"  (1.651 m)   Wt 204 lb 9.6 oz (92.8 kg)   SpO2 93%   BMI 34.05 kg/m  Physical Exam  Constitutional: She is oriented to person, place, and time. She appears well-developed and well-nourished. No distress.  HENT:  Head: Normocephalic and atraumatic.  Right Ear: External ear normal.  Left Ear: External ear normal.  Nose: Nose normal.  Mouth/Throat: Oropharynx is clear and moist.  Eyes: Conjunctivae and EOM are normal. Pupils are equal, round, and reactive to light.  Neck: Normal range of motion and full passive range of motion without pain. Neck supple. No JVD present. Carotid bruit is not present. No thyromegaly present.  Cardiovascular: Normal rate, regular rhythm and normal heart sounds.  Exam reveals no gallop and no friction rub.   No murmur heard. Pulmonary/Chest: Effort normal and breath sounds normal. She has no wheezes. She has no rales. Right breast exhibits no inverted nipple, no mass, no nipple discharge,  no skin change and no tenderness. Left breast exhibits no inverted nipple, no mass, no nipple discharge, no skin change and no tenderness. Breasts are symmetrical.  Abdominal: Soft. Bowel sounds are normal. She exhibits no distension and no mass. There is no tenderness. There is no rebound and no guarding.  Genitourinary: Vagina normal. There is no rash, tenderness, lesion or injury on the right labia. There is no rash, tenderness, lesion or injury on the left labia.  Musculoskeletal:       Right shoulder: Normal.       Left shoulder: Normal.       Cervical back: Normal.  Lymphadenopathy:    She has no cervical adenopathy.  Neurological: She is alert and oriented to person, place, and time. She has normal reflexes. No cranial nerve deficit. She exhibits normal muscle tone. Coordination normal.  Skin: Skin is warm and dry. No rash noted. She is not diaphoretic. No erythema. No pallor.    Psychiatric: She has a normal mood and affect. Her behavior is normal. Judgment and thought content normal.  Nursing note and vitals reviewed.  Results for orders placed or performed in visit on 02/16/16  CBC with Differential/Platelet  Result Value Ref Range   WBC 4.6 3.4 - 10.8 x10E3/uL   RBC 4.35 3.77 - 5.28 x10E6/uL   Hemoglobin 13.2 11.1 - 15.9 g/dL   Hematocrit 40.6 34.0 - 46.6 %   MCV 93 79 - 97 fL   MCH 30.3 26.6 - 33.0 pg   MCHC 32.5 31.5 - 35.7 g/dL   RDW 13.3 12.3 - 15.4 %   Platelets 250 150 - 379 x10E3/uL   Neutrophils 49 Not Estab. %   Lymphs 39 Not Estab. %   Monocytes 9 Not Estab. %   Eos 3 Not Estab. %   Basos 0 Not Estab. %   Neutrophils Absolute 2.3 1.4 - 7.0 x10E3/uL   Lymphocytes Absolute 1.8 0.7 - 3.1 x10E3/uL   Monocytes Absolute 0.4 0.1 - 0.9 x10E3/uL   EOS (ABSOLUTE) 0.1 0.0 - 0.4 x10E3/uL   Basophils Absolute 0.0 0.0 - 0.2 x10E3/uL   Immature Granulocytes 0 Not Estab. %   Immature Grans (Abs) 0.0 0.0 - 0.1 x10E3/uL  Comprehensive metabolic panel  Result Value Ref Range   Glucose 84 65 - 99 mg/dL   BUN 12 8 - 27 mg/dL   Creatinine, Ser 0.69 0.57 - 1.00 mg/dL   GFR calc non Af Amer 92 >59 mL/min/1.73   GFR calc Af Amer 106 >59 mL/min/1.73   BUN/Creatinine Ratio 17 12 - 28   Sodium 139 134 - 144 mmol/L   Potassium 4.3 3.5 - 5.2 mmol/L   Chloride 101 96 - 106 mmol/L   CO2 23 18 - 29 mmol/L   Calcium 9.8 8.7 - 10.3 mg/dL   Total Protein 6.9 6.0 - 8.5 g/dL   Albumin 4.5 3.6 - 4.8 g/dL   Globulin, Total 2.4 1.5 - 4.5 g/dL   Albumin/Globulin Ratio 1.9 1.2 - 2.2   Bilirubin Total 0.6 0.0 - 1.2 mg/dL   Alkaline Phosphatase 70 39 - 117 IU/L   AST 21 0 - 40 IU/L   ALT 20 0 - 32 IU/L  Lipid panel  Result Value Ref Range   Cholesterol, Total 211 (H) 100 - 199 mg/dL   Triglycerides 55 0 - 149 mg/dL   HDL 71 >39 mg/dL   VLDL Cholesterol Cal 11 5 - 40 mg/dL   LDL Calculated 129 (H) 0 - 99 mg/dL  Chol/HDL Ratio 3.0 0.0 - 4.4 ratio units  TSH  Result  Value Ref Range   TSH 4.470 0.450 - 4.500 uIU/mL  T4, free  Result Value Ref Range   Free T4 1.54 0.82 - 1.77 ng/dL  VITAMIN D 25 Hydroxy (Vit-D Deficiency, Fractures)  Result Value Ref Range   Vit D, 25-Hydroxy 21.5 (L) 30.0 - 100.0 ng/mL  POCT urinalysis dipstick  Result Value Ref Range   Color, UA yellow yellow   Clarity, UA clear clear   Glucose, UA negative negative   Bilirubin, UA negative negative   Ketones, POC UA negative negative   Spec Grav, UA 1.015    Blood, UA negative negative   pH, UA 7.5    Protein Ur, POC negative negative   Urobilinogen, UA 0.2    Nitrite, UA Negative Negative   Leukocytes, UA small (1+) (A) Negative   Fall Risk  02/16/2016 09/29/2015 07/28/2013  Falls in the past year? No No No   Depression screen [facility] 2/9 02/16/2016 09/29/2015 11/13/2014 07/28/2013  Decreased Interest 0 0 0 0  Down, Depressed, Hopeless 0 0 0 0  PHQ - 2 Score 0 0 0 0   Functional Status Survey: Is the patient deaf or have difficulty hearing?: No Does the patient have difficulty seeing, even when wearing glasses/contacts?: No Does the patient have difficulty concentrating, remembering, or making decisions?: No Does the patient have difficulty walking or climbing stairs?: No Does the patient have difficulty dressing or bathing?: No Does the patient have difficulty doing errands alone such as visiting a doctor's office or shopping?: No     Assessment & Plan:   1. Welcome to Medicare preventive visit   2. Encounter for screening mammogram for breast cancer   3. Estrogen deficiency   4. Colon cancer screening   5. Need for influenza vaccination   6. Need for prophylactic vaccination against Streptococcus pneumoniae (pneumococcus)   7. Vitamin D deficiency   8. Screening, lipid   9. Screening for diabetes mellitus   10. Hypothyroidism due to acquired atrophy of thyroid   11. Anxiety and depression   12. Breast disorder   13. Skin lesion   14. Fatty liver disease,  nonalcoholic   15. Class 1 obesity due to excess calories with serious comorbidity and body mass index (BMI) of 34.0 to 34.9 in adult     Orders Placed This Encounter  Procedures  . MM SCREENING BREAST TOMO BILATERAL    Standing Status:   Future    Standing Expiration Date:   04/18/2017    Order Specific Question:   Reason for Exam (SYMPTOM  OR DIAGNOSIS REQUIRED)    Answer:   annual screening    Order Specific Question:   Preferred imaging location?    Answer:   [facility]  . DG Bone Density    Standing Status:   Future    Standing Expiration Date:   04/18/2017    Order Specific Question:   Reason for Exam (SYMPTOM  OR DIAGNOSIS REQUIRED)    Answer:   estrogen deficiency    Order Specific Question:   Preferred imaging location?    Answer:   [facility]  . Pneumococcal conjugate vaccine 13-valent IM  . Flu Vaccine QUAD 36+ mos IM  . CBC with Differential/Platelet  . Comprehensive metabolic panel    Order Specific Question:   Has the patient fasted?    Answer:   Yes  . Lipid panel    Order Specific Question:  Has the patient fasted?    Answer:   Yes  . TSH  . T4, free  . VITAMIN D 25 Hydroxy (Vit-D Deficiency, Fractures)  . Ambulatory referral to Gastroenterology    Referral Priority:   Routine    Referral Type:   Consultation    Referral Reason:   Specialty Services Required    Requested Specialty:   Gastroenterology    Number of Visits Requested:   1  . Ambulatory referral to Dermatology    Referral Priority:   Routine    Referral Type:   Consultation    Referral Reason:   Specialty Services Required    Requested Specialty:   Dermatology    Number of Visits Requested:   1  . POCT urinalysis dipstick  . EKG 12-Lead   Meds ordered this encounter  Medications  . levothyroxine (SYNTHROID, LEVOTHROID) 88 MCG tablet    Sig: TAKE 1 TABLET BY MOUTH EVERY MORNING BEFORE BREAKFAST    Dispense:  90 tablet    Refill:  3  . citalopram (CELEXA) 20 MG tablet    Sig:  Take 1 tablet (20 mg total) by mouth daily.    Dispense:  90 tablet    Refill:  3    Return in about 1 year (around 02/15/2017) for complete physical examiniation.   Josslin Sanjuan Elayne Guerin, M.D. [facility] [address] [place], [place]  [address] [phone] phone [phone] fax

## 2016-02-17 LAB — CBC WITH DIFFERENTIAL/PLATELET
BASOS ABS: 0 10*3/uL (ref 0.0–0.2)
Basos: 0 %
EOS (ABSOLUTE): 0.1 10*3/uL (ref 0.0–0.4)
EOS: 3 %
HEMATOCRIT: 40.6 % (ref 34.0–46.6)
HEMOGLOBIN: 13.2 g/dL (ref 11.1–15.9)
IMMATURE GRANS (ABS): 0 10*3/uL (ref 0.0–0.1)
IMMATURE GRANULOCYTES: 0 %
LYMPHS ABS: 1.8 10*3/uL (ref 0.7–3.1)
LYMPHS: 39 %
MCH: 30.3 pg (ref 26.6–33.0)
MCHC: 32.5 g/dL (ref 31.5–35.7)
MCV: 93 fL (ref 79–97)
MONOCYTES: 9 %
Monocytes Absolute: 0.4 10*3/uL (ref 0.1–0.9)
Neutrophils Absolute: 2.3 10*3/uL (ref 1.4–7.0)
Neutrophils: 49 %
Platelets: 250 10*3/uL (ref 150–379)
RBC: 4.35 x10E6/uL (ref 3.77–5.28)
RDW: 13.3 % (ref 12.3–15.4)
WBC: 4.6 10*3/uL (ref 3.4–10.8)

## 2016-02-17 LAB — COMPREHENSIVE METABOLIC PANEL
ALBUMIN: 4.5 g/dL (ref 3.6–4.8)
ALK PHOS: 70 IU/L (ref 39–117)
ALT: 20 IU/L (ref 0–32)
AST: 21 IU/L (ref 0–40)
Albumin/Globulin Ratio: 1.9 (ref 1.2–2.2)
BUN / CREAT RATIO: 17 (ref 12–28)
BUN: 12 mg/dL (ref 8–27)
Bilirubin Total: 0.6 mg/dL (ref 0.0–1.2)
CALCIUM: 9.8 mg/dL (ref 8.7–10.3)
CO2: 23 mmol/L (ref 18–29)
CREATININE: 0.69 mg/dL (ref 0.57–1.00)
Chloride: 101 mmol/L (ref 96–106)
GFR, EST AFRICAN AMERICAN: 106 mL/min/{1.73_m2} (ref >59)
GFR, EST NON AFRICAN AMERICAN: 92 mL/min/{1.73_m2} (ref >59)
GLOBULIN, TOTAL: 2.4 g/dL (ref 1.5–4.5)
GLUCOSE: 84 mg/dL (ref 65–99)
Potassium: 4.3 mmol/L (ref 3.5–5.2)
SODIUM: 139 mmol/L (ref 134–144)
TOTAL PROTEIN: 6.9 g/dL (ref 6.0–8.5)

## 2016-02-17 LAB — VITAMIN D 25 HYDROXY (VIT D DEFICIENCY, FRACTURES): VIT D 25 HYDROXY: 21.5 ng/mL — AB (ref 30.0–100.0)

## 2016-02-17 LAB — LIPID PANEL
CHOL/HDL RATIO: 3 ratio (ref 0.0–4.4)
CHOLESTEROL TOTAL: 211 mg/dL — AB (ref 100–199)
HDL: 71 mg/dL (ref >39)
LDL CALC: 129 mg/dL — AB (ref 0–99)
Triglycerides: 55 mg/dL (ref 0–149)
VLDL CHOLESTEROL CAL: 11 mg/dL (ref 5–40)

## 2016-02-17 LAB — T4, FREE: FREE T4: 1.54 ng/dL (ref 0.82–1.77)

## 2016-02-17 LAB — TSH: TSH: 4.47 u[IU]/mL (ref 0.450–4.500)

## 2016-02-28 ENCOUNTER — Telehealth: Payer: Self-pay

## 2016-02-28 NOTE — Telephone Encounter (Signed)
Patient saw Tamala Julian on the 12/6 and is asking for her to call her in something for a sore throat.  Please advise  [phone]

## 2016-03-01 ENCOUNTER — Encounter: Payer: Medicare HMO | Admitting: Family Medicine

## 2016-03-03 NOTE — Telephone Encounter (Signed)
Pt complained about sinus drng and sore throat x days. Denies fever.  using aleve and otc sinus meds with relief, suggested ov today for eval.

## 2016-03-07 ENCOUNTER — Ambulatory Visit: Payer: Medicare HMO

## 2016-04-06 DIAGNOSIS — H5203 Hypermetropia, bilateral: Secondary | ICD-10-CM | POA: Diagnosis not present

## 2016-04-06 DIAGNOSIS — Z0100 Encounter for examination of eyes and vision without abnormal findings: Secondary | ICD-10-CM | POA: Diagnosis not present

## 2016-04-27 DIAGNOSIS — Z0100 Encounter for examination of eyes and vision without abnormal findings: Secondary | ICD-10-CM | POA: Diagnosis not present

## 2016-06-04 ENCOUNTER — Encounter: Payer: Self-pay | Admitting: Family Medicine

## 2016-07-28 DIAGNOSIS — Z1211 Encounter for screening for malignant neoplasm of colon: Secondary | ICD-10-CM | POA: Diagnosis not present

## 2016-07-28 DIAGNOSIS — E669 Obesity, unspecified: Secondary | ICD-10-CM | POA: Diagnosis not present

## 2016-09-05 DIAGNOSIS — L720 Epidermal cyst: Secondary | ICD-10-CM | POA: Diagnosis not present

## 2016-09-05 DIAGNOSIS — D229 Melanocytic nevi, unspecified: Secondary | ICD-10-CM | POA: Diagnosis not present

## 2016-09-05 DIAGNOSIS — D239 Other benign neoplasm of skin, unspecified: Secondary | ICD-10-CM | POA: Diagnosis not present

## 2016-09-05 DIAGNOSIS — L649 Androgenic alopecia, unspecified: Secondary | ICD-10-CM | POA: Diagnosis not present

## 2016-09-05 DIAGNOSIS — L819 Disorder of pigmentation, unspecified: Secondary | ICD-10-CM | POA: Diagnosis not present

## 2016-09-05 DIAGNOSIS — L821 Other seborrheic keratosis: Secondary | ICD-10-CM | POA: Diagnosis not present

## 2016-09-05 DIAGNOSIS — C4441 Basal cell carcinoma of skin of scalp and neck: Secondary | ICD-10-CM | POA: Diagnosis not present

## 2016-09-05 DIAGNOSIS — C4491 Basal cell carcinoma of skin, unspecified: Secondary | ICD-10-CM

## 2016-09-05 DIAGNOSIS — L300 Nummular dermatitis: Secondary | ICD-10-CM | POA: Diagnosis not present

## 2016-09-05 HISTORY — DX: Basal cell carcinoma of skin, unspecified: C44.91

## 2016-12-07 ENCOUNTER — Ambulatory Visit: Admission: RE | Admit: 2016-12-07 | Payer: Medicare HMO | Source: Ambulatory Visit | Admitting: Gastroenterology

## 2016-12-07 ENCOUNTER — Encounter: Admission: RE | Payer: Self-pay | Source: Ambulatory Visit

## 2016-12-07 SURGERY — COLONOSCOPY WITH PROPOFOL
Anesthesia: General

## 2016-12-20 ENCOUNTER — Ambulatory Visit (INDEPENDENT_AMBULATORY_CARE_PROVIDER_SITE_OTHER): Payer: Medicare HMO

## 2016-12-20 ENCOUNTER — Ambulatory Visit (INDEPENDENT_AMBULATORY_CARE_PROVIDER_SITE_OTHER): Payer: Medicare HMO | Admitting: Family Medicine

## 2016-12-20 ENCOUNTER — Encounter: Payer: Self-pay | Admitting: Family Medicine

## 2016-12-20 VITALS — BP 138/70 | HR 78 | Temp 98.0°F | Resp 16 | Ht 64.96 in | Wt 202.0 lb

## 2016-12-20 DIAGNOSIS — R197 Diarrhea, unspecified: Secondary | ICD-10-CM

## 2016-12-20 DIAGNOSIS — Z23 Encounter for immunization: Secondary | ICD-10-CM | POA: Diagnosis not present

## 2016-12-20 DIAGNOSIS — R109 Unspecified abdominal pain: Secondary | ICD-10-CM | POA: Diagnosis not present

## 2016-12-20 DIAGNOSIS — E559 Vitamin D deficiency, unspecified: Secondary | ICD-10-CM

## 2016-12-20 NOTE — Progress Notes (Signed)
Subjective:    Patient ID: Christina Mcgee, female    DOB: January 30, 1951, 66 y.o.   MRN: [id number]  12/20/2016  Diarrhea (with Gas since Aug. )    HPI This 66 y.o. female presents for evaluation of diarrhea for six weeks.  Horrible gas with diarrhea.  Not a lot but still diarrhea.  Since August, has not had a good solid bowel movement.  Loose stools.  Started on diet to lose weight; started eating spinach organic spring mix.  Worried about infection.  Also started Rogaine but stopped it did not improve.  No fever/chills/sweats.  No nausea or vomiting; mild nausea; no heartburn or indigestion.  No bloody stools; did have melena with pepto bismol.  Tried Imodium with some releif.  Having gas explosions three times per day; small stool amount but loose.  Really loose; dog poops.  No abx; no travel.  No camping.  No other contacts with diarrhea; husband with diarrhea but due to metformin.  Scheduled for GI consultation at end of 11/2016; rescheduled colonoscopy due to granddaughter's birthday; scheduled colonoscopy for 04/2017.  Called GI in September who recommended pepto bismol for two days and come in but did not come in; called yesterday for appointment.    BP Readings from Last 3 Encounters:  12/20/16 138/70  02/16/16 130/60  09/29/15 120/78   Wt Readings from Last 3 Encounters:  12/20/16 202 lb (91.6 kg)  02/16/16 204 lb 9.6 oz (92.8 kg)  09/29/15 201 lb 3.2 oz (91.3 kg)   Immunization History  Administered Date(s) Administered  . Influenza,inj,Quad PF,6+ Mos 11/13/2014, 02/16/2016, 12/20/2016  . Pneumococcal Conjugate-13 02/16/2016    Review of Systems  Constitutional: Negative for chills, diaphoresis, fatigue and fever.  Eyes: Negative for visual disturbance.  Respiratory: Negative for cough and shortness of breath.   Cardiovascular: Negative for chest pain, palpitations and leg swelling.  Gastrointestinal: Positive for abdominal distention and diarrhea. Negative for abdominal pain,  anal bleeding, blood in stool, constipation, nausea, rectal pain and vomiting.  Endocrine: Negative for cold intolerance, heat intolerance, polydipsia, polyphagia and polyuria.  Neurological: Negative for dizziness, tremors, seizures, syncope, facial asymmetry, speech difficulty, weakness, light-headedness, numbness and headaches.    Past Medical History:  Diagnosis Date  . Allergy   . Anxiety   . Cervical dysplasia age 1  . Fatty liver disease, nonalcoholic    s/p GI consultation; [facility] in [place].  . Hypothyroid    Past Surgical History:  Procedure Laterality Date  . CESAREAN SECTION    . COLPOSCOPY    . GYNECOLOGIC CRYOSURGERY  age 89   No Known Allergies Current Outpatient Prescriptions on File Prior to Visit  Medication Sig Dispense Refill  . Cholecalciferol (VITAMIN D) 400 UNIT/ML LIQD Take by mouth.    . citalopram (CELEXA) 20 MG tablet Take 1 tablet (20 mg total) by mouth daily. 90 tablet 3  . levothyroxine (SYNTHROID, LEVOTHROID) 88 MCG tablet TAKE 1 TABLET BY MOUTH EVERY MORNING BEFORE BREAKFAST 90 tablet 3  . methocarbamol (ROBAXIN) 500 MG tablet Take 1 tablet (500 mg total) by mouth every 6 (six) hours as needed for muscle spasms. 45 tablet 0   No current facility-administered medications on file prior to visit.    Social History   Social History  . Marital status: Married    Spouse name: N/A  . Number of children: N/A  . Years of education: N/A   Occupational History  . Not on file.   Social History Main Topics  .  Smoking status: Former Research scientist (life sciences)  . Smokeless tobacco: Never Used  . Alcohol use Yes     Comment: Occas  . Drug use: No  . Sexual activity: Yes    Birth control/ protection: Post-menopausal   Other Topics Concern  . Not on file   Social History Narrative   Marital status: married x 41 years; happily married; no abuse       Children:  3 children (37, 41, 13); 4 grandchildren in [place] within 30 minutes.       Lives: with husband, youngest  son.      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 Wprking 2 days per week; [employer] scattered.      Tobacco; none       Alcohol:  Socially sporadically; once per week.      Exercise:  None in 2017      Seatbelt:  100%      Guns: yes; unloaded.      Advanced Directives: FULL CODE.  YES.    Family History  Problem Relation Age of Onset  . Breast cancer Mother 81  . Leukemia Mother   . Cancer Mother        leukemia; breast cancer at age 41.  Marland Kitchen Hypertension Father   . Heart disease Father 66       CHF  . Hyperlipidemia Father   . Cancer Sister        Bladder  . Hyperlipidemia Brother   . Hypertension Brother   . Cancer Maternal Grandmother        BREAST  . Heart disease Maternal Grandfather   . Hyperlipidemia Maternal Grandfather   . Hypertension Maternal Grandfather   . Cancer Paternal Grandmother        BREAST       Objective:    BP 138/70   Pulse 78   Temp 98 F (36.7 C) (Oral)   Resp 16   Ht 5' 4.96" (1.65 m)   Wt 202 lb (91.6 kg)   SpO2 97%   BMI 33.65 kg/m  Physical Exam  Constitutional: She is oriented to person, place, and time. She appears well-developed and well-nourished. No distress.  HENT:  Head: Normocephalic and atraumatic.  Right Ear: External ear normal.  Left Ear: External ear normal.  Nose: Nose normal.  Mouth/Throat: Oropharynx is clear and moist.  Eyes: Pupils are equal, round, and reactive to light. Conjunctivae and EOM are normal.  Neck: Normal range of motion. Neck supple. Carotid bruit is not present. No thyromegaly present.  Cardiovascular: Normal rate, regular rhythm, normal heart sounds and intact distal pulses.  Exam reveals no gallop and no friction rub.   No murmur heard. Pulmonary/Chest: Effort normal and breath sounds normal. She has no wheezes. She has no rales.  Abdominal: Soft. Bowel sounds are normal. She exhibits no distension and no mass. There is no tenderness. There is no rebound and no  guarding.  Lymphadenopathy:    She has no cervical adenopathy.  Neurological: She is alert and oriented to person, place, and time. No cranial nerve deficit.  Skin: Skin is warm and dry. No rash noted. She is not diaphoretic. No erythema. No pallor.  Psychiatric: She has a normal mood and affect. Her behavior is normal.  Nursing note and vitals reviewed.  No results found. Depression screen [facility] 2/9 12/20/2016 02/16/2016 09/29/2015 11/13/2014 07/28/2013  Decreased Interest 0 0 0 0 0  Down, Depressed, Hopeless 0 0 0 0  0  PHQ - 2 Score 0 0 0 0 0   Fall Risk  12/20/2016 02/16/2016 09/29/2015 07/28/2013  Falls in the past year? No No No No        Assessment & Plan:   1. Diarrhea of presumed infectious origin   2. Vitamin D deficiency   3. Need for prophylactic vaccination and inoculation against influenza    -New onset diarrhea with bloating for the past six months. -obtain stool studies and labs.  Also obtain AAS due to history of constipation.  May warrant stool softener to treat constipation to allow diarrhea to resolve. -Has upcoming appointment with GI.  Scheduled for colonoscopy in 04/2017. -uncontrolled Vitamin D deficiency; repeat labs today.   Orders Placed This Encounter  Procedures  . Stool culture  . Clostridium Difficile by PCR    Order Specific Question:   Is your patient experiencing loose or watery stools (3 or more in 24 hours)?    Answer:   Yes    Order Specific Question:   Has the patient received laxatives in the last 24 hours?    Answer:   No    Order Specific Question:   Has a negative Cdiff test resulted in the last 7 days?    Answer:   No  . Ova and parasite examination  . DG Abd 2 Views    Standing Status:   Future    Number of Occurrences:   1    Standing Expiration Date:   12/20/2017    Order Specific Question:   Reason for Exam (SYMPTOM  OR DIAGNOSIS REQUIRED)    Answer:   loose small stools for three months; history of constipation    Order Specific  Question:   Preferred imaging location?    Answer:   External  . Flu Vaccine QUAD 36+ mos IM  . CBC with Differential/Platelet  . Comprehensive metabolic panel  . TSH  . VITAMIN D 25 Hydroxy (Vit-D Deficiency, Fractures)   No orders of the defined types were placed in this encounter.   No Follow-up on file.   Prestyn Mahn Elayne Guerin, M.D. [facility] [place] [address] [place], [place]  [address] [phone] phone [phone] fax

## 2016-12-20 NOTE — Patient Instructions (Addendum)
     IF you received an x-ray today, you will receive an invoice from [facility]. Please contact [facility] at [phone] with questions or concerns regarding your invoice.   IF you received labwork today, you will receive an invoice from [facility]. Please contact [facility] at [phone] with questions or concerns regarding your invoice.   Our billing staff will not be able to assist you with questions regarding bills from these companies.  You will be contacted with the lab results as soon as they are available. The fastest way to get your results is to activate your My Chart account. Instructions are located on the last page of this paperwork. If you have not heard from [place] regarding the results in 2 weeks, please contact this office.      Bland Diet A bland diet consists of foods that do not have a lot of fat or fiber. Foods without fat or fiber are easier for the body to digest. They are also less likely to irritate your mouth, throat, stomach, and other parts of your gastrointestinal tract. A bland diet is sometimes called a BRAT diet. What is my plan? Your health care provider or dietitian may recommend specific changes to your diet to prevent and treat your symptoms, such as:  Eating small meals often.  Cooking food until it is soft enough to chew easily.  Chewing your food well.  Drinking fluids slowly.  Not eating foods that are very spicy, sour, or fatty.  Not eating citrus fruits, such as oranges and grapefruit.  What do I need to know about this diet?  Eat a variety of foods from the bland diet food list.  Do not follow a bland diet longer than you have to.  Ask your health care provider whether you should take vitamins. What foods can I eat? Grains  Hot cereals, such as cream of wheat. Bread, crackers, or tortillas made from refined white flour. Rice. Vegetables Canned or cooked vegetables. Mashed or boiled potatoes. Fruits Bananas.  Applesauce. Other types of cooked or canned fruit with the skin and seeds removed, such as canned peaches or pears. Meats and Other Protein Sources Scrambled eggs. Creamy peanut butter or other nut butters. Lean, well-cooked meats, such as chicken or fish. Tofu. Soups or broths. Dairy Low-fat dairy products, such as milk, cottage cheese, or yogurt. Beverages Water. Herbal tea. Apple juice. Sweets and Desserts Pudding. Custard. Fruit gelatin. Ice cream. Fats and Oils Mild salad dressings. Canola or olive oil. The items listed above may not be a complete list of allowed foods or beverages. Contact your dietitian for more options. What foods are not recommended? Foods and ingredients that are often not recommended include:  Spicy foods, such as hot sauce or salsa.  Fried foods.  Sour foods, such as pickled or fermented foods.  Raw vegetables or fruits, especially citrus or berries.  Caffeinated drinks.  Alcohol.  Strongly flavored seasonings or condiments.  The items listed above may not be a complete list of foods and beverages that are not allowed. Contact your dietitian for more information. This information is not intended to replace advice given to you by your health care provider. Make sure you discuss any questions you have with your health care provider. Document Released: 06/21/2015 Document Revised: 08/05/2015 Document Reviewed: 03/11/2014 Elsevier Interactive Patient Education  2018 [employer].

## 2016-12-21 ENCOUNTER — Other Ambulatory Visit: Payer: Self-pay | Admitting: Family Medicine

## 2016-12-21 DIAGNOSIS — R197 Diarrhea, unspecified: Secondary | ICD-10-CM | POA: Diagnosis not present

## 2016-12-21 LAB — TSH: TSH: 3.82 u[IU]/mL (ref 0.450–4.500)

## 2016-12-21 LAB — CBC WITH DIFFERENTIAL/PLATELET
BASOS ABS: 0 10*3/uL (ref 0.0–0.2)
BASOS: 0 %
EOS (ABSOLUTE): 0.2 10*3/uL (ref 0.0–0.4)
EOS: 3 %
Hematocrit: 40.3 % (ref 34.0–46.6)
Hemoglobin: 13.3 g/dL (ref 11.1–15.9)
IMMATURE GRANS (ABS): 0 10*3/uL (ref 0.0–0.1)
IMMATURE GRANULOCYTES: 0 %
LYMPHS: 36 %
Lymphocytes Absolute: 2.4 10*3/uL (ref 0.7–3.1)
MCH: 30.9 pg (ref 26.6–33.0)
MCHC: 33 g/dL (ref 31.5–35.7)
MCV: 94 fL (ref 79–97)
MONOCYTES: 12 %
Monocytes Absolute: 0.8 10*3/uL (ref 0.1–0.9)
NEUTROS PCT: 49 %
Neutrophils Absolute: 3.2 10*3/uL (ref 1.4–7.0)
PLATELETS: 269 10*3/uL (ref 150–379)
RBC: 4.31 x10E6/uL (ref 3.77–5.28)
RDW: 12.9 % (ref 12.3–15.4)
WBC: 6.6 10*3/uL (ref 3.4–10.8)

## 2016-12-21 LAB — COMPREHENSIVE METABOLIC PANEL
ALT: 26 IU/L (ref 0–32)
AST: 24 IU/L (ref 0–40)
Albumin/Globulin Ratio: 1.6 (ref 1.2–2.2)
Albumin: 4.4 g/dL (ref 3.6–4.8)
Alkaline Phosphatase: 74 IU/L (ref 39–117)
BUN/Creatinine Ratio: 16 (ref 12–28)
BUN: 10 mg/dL (ref 8–27)
Bilirubin Total: 0.3 mg/dL (ref 0.0–1.2)
CALCIUM: 10 mg/dL (ref 8.7–10.3)
CO2: 26 mmol/L (ref 20–29)
Chloride: 105 mmol/L (ref 96–106)
Creatinine, Ser: 0.61 mg/dL (ref 0.57–1.00)
GFR, EST AFRICAN AMERICAN: 109 mL/min/{1.73_m2} (ref >59)
GFR, EST NON AFRICAN AMERICAN: 95 mL/min/{1.73_m2} (ref >59)
GLUCOSE: 83 mg/dL (ref 65–99)
Globulin, Total: 2.8 g/dL (ref 1.5–4.5)
Potassium: 4.8 mmol/L (ref 3.5–5.2)
Sodium: 144 mmol/L (ref 134–144)
TOTAL PROTEIN: 7.2 g/dL (ref 6.0–8.5)

## 2016-12-21 LAB — VITAMIN D 25 HYDROXY (VIT D DEFICIENCY, FRACTURES): VIT D 25 HYDROXY: 32.6 ng/mL (ref 30.0–100.0)

## 2016-12-22 ENCOUNTER — Encounter: Payer: Self-pay | Admitting: Family Medicine

## 2016-12-25 LAB — CLOSTRIDIUM DIFFICILE BY PCR: Toxigenic C. Difficile by PCR: NEGATIVE

## 2016-12-31 LAB — OVA AND PARASITE EXAMINATION

## 2016-12-31 LAB — STOOL CULTURE: E coli, Shiga toxin Assay: NEGATIVE

## 2017-02-16 ENCOUNTER — Ambulatory Visit: Payer: Medicare HMO

## 2017-02-16 ENCOUNTER — Ambulatory Visit (INDEPENDENT_AMBULATORY_CARE_PROVIDER_SITE_OTHER): Payer: Medicare HMO

## 2017-02-16 VITALS — BP 126/78 | HR 66 | Temp 97.8°F | Ht 65.0 in | Wt 205.5 lb

## 2017-02-16 DIAGNOSIS — Z1231 Encounter for screening mammogram for malignant neoplasm of breast: Secondary | ICD-10-CM | POA: Diagnosis not present

## 2017-02-16 DIAGNOSIS — Z0000 Encounter for general adult medical examination without abnormal findings: Secondary | ICD-10-CM | POA: Diagnosis not present

## 2017-02-16 LAB — HM MAMMOGRAPHY

## 2017-02-16 NOTE — Patient Instructions (Addendum)
Christina Mcgee , Thank you for taking time to come for your Medicare Wellness Visit. I appreciate your ongoing commitment to your health goals. Please review the following plan we discussed and let me know if I can assist you in the future.   Screening recommendations/referrals: Colonoscopy:  You have this scheduled for February 2019.ammogram: Scheduled for today Bone Density: up to date, next due 06/27/2017 Recommended yearly ophthalmology/optometry visit for glaucoma screening and checkup Recommended yearly dental visit for hygiene and checkup  Vaccinations: Influenza vaccine: up to date Pneumococcal vaccine: declined today, Will have this done next week  Tdap vaccine: up to date, next due 05/09/2020 Shingles vaccine: Check with your pharmacy about receiving this vaccine    Advanced directives: Please bring a copy of your POA (Power of [place]) and/or Living Will to your next appointment.   Conditions/risks identified: Try to start exercising more and lose weight and get to around 175 lbs.   Next appointment: 02/23/17 @ 9 am with Dr. Tamala Julian    Preventive Care 65 Years and Older, Female Preventive care refers to lifestyle choices and visits with your health care provider that can promote health and wellness. What does preventive care include?  A yearly physical exam. This is also called an annual well check.  Dental exams once or twice a year.  Routine eye exams. Ask your health care provider how often you should have your eyes checked.  Personal lifestyle choices, including:  Daily care of your teeth and gums.  Regular physical activity.  Eating a healthy diet.  Avoiding tobacco and drug use.  Limiting alcohol use.  Practicing safe sex.  Taking low-dose aspirin every day.  Taking vitamin and mineral supplements as recommended by your health care provider. What happens during an annual well check? The services and screenings done by your health care provider during your  annual well check will depend on your age, overall health, lifestyle risk factors, and family history of disease. Counseling  Your health care provider may ask you questions about your:  Alcohol use.  Tobacco use.  Drug use.  Emotional well-being.  Home and relationship well-being.  Sexual activity.  Eating habits.  History of falls.  Memory and ability to understand (cognition).  Work and work Statistician.  Reproductive health. Screening  You may have the following tests or measurements:  Height, weight, and BMI.  Blood pressure.  Lipid and cholesterol levels. These may be checked every 5 years, or more frequently if you are over 17 years old.  Skin check.  Lung cancer screening. You may have this screening every year starting at age 29 if you have a 30-pack-year history of smoking and currently smoke or have quit within the past 15 years.  Fecal occult blood test (FOBT) of the stool. You may have this test every year starting at age 59.  Flexible sigmoidoscopy or colonoscopy. You may have a sigmoidoscopy every 5 years or a colonoscopy every 10 years starting at age 66.  Hepatitis C blood test.  Hepatitis B blood test.  Sexually transmitted disease (STD) testing.  Diabetes screening. This is done by checking your blood sugar (glucose) after you have not eaten for a while (fasting). You may have this done every 1-3 years.  Bone density scan. This is done to screen for osteoporosis. You may have this done starting at age 80.  Mammogram. This may be done every 1-2 years. Talk to your health care provider about how often you should have regular mammograms. Talk with  your health care provider about your test results, treatment options, and if necessary, the need for more tests. Vaccines  Your health care provider may recommend certain vaccines, such as:  Influenza vaccine. This is recommended every year.  Tetanus, diphtheria, and acellular pertussis (Tdap, Td)  vaccine. You may need a Td booster every 10 years.  Zoster vaccine. You may need this after age 71.  Pneumococcal 13-valent conjugate (PCV13) vaccine. One dose is recommended after age 27.  Pneumococcal polysaccharide (PPSV23) vaccine. One dose is recommended after age 16. Talk to your health care provider about which screenings and vaccines you need and how often you need them. This information is not intended to replace advice given to you by your health care provider. Make sure you discuss any questions you have with your health care provider. Document Released: 03/26/2015 Document Revised: 11/17/2015 Document Reviewed: 12/29/2014 Elsevier Interactive Patient Education  2017 [place] Prevention in the Home Falls can cause injuries. They can happen to people of all ages. There are many things you can do to make your home safe and to help prevent falls. What can I do on the outside of my home?  Regularly fix the edges of walkways and driveways and fix any cracks.  Remove anything that might make you trip as you walk through a door, such as a raised step or threshold.  Trim any bushes or trees on the path to your home.  Use bright outdoor lighting.  Clear any walking paths of anything that might make someone trip, such as rocks or tools.  Regularly check to see if handrails are loose or broken. Make sure that both sides of any steps have handrails.  Any raised decks and porches should have guardrails on the edges.  Have any leaves, snow, or ice cleared regularly.  Use sand or salt on walking paths during winter.  Clean up any spills in your garage right away. This includes oil or grease spills. What can I do in the bathroom?  Use night lights.  Install grab bars by the toilet and in the tub and shower. Do not use towel bars as grab bars.  Use non-skid mats or decals in the tub or shower.  If you need to sit down in the shower, use a plastic, non-slip  stool.  Keep the floor dry. Clean up any water that spills on the floor as soon as it happens.  Remove soap buildup in the tub or shower regularly.  Attach bath mats securely with double-sided non-slip rug tape.  Do not have throw rugs and other things on the floor that can make you trip. What can I do in the bedroom?  Use night lights.  Make sure that you have a light by your bed that is easy to reach.  Do not use any sheets or blankets that are too big for your bed. They should not hang down onto the floor.  Have a firm chair that has side arms. You can use this for support while you get dressed.  Do not have throw rugs and other things on the floor that can make you trip. What can I do in the kitchen?  Clean up any spills right away.  Avoid walking on wet floors.  Keep items that you use a lot in easy-to-reach places.  If you need to reach something above you, use a strong step stool that has a grab bar.  Keep electrical cords out of the way.  Do not  use floor polish or wax that makes floors slippery. If you must use wax, use non-skid floor wax.  Do not have throw rugs and other things on the floor that can make you trip. What can I do with my stairs?  Do not leave any items on the stairs.  Make sure that there are handrails on both sides of the stairs and use them. Fix handrails that are broken or loose. Make sure that handrails are as long as the stairways.  Check any carpeting to make sure that it is firmly attached to the stairs. Fix any carpet that is loose or worn.  Avoid having throw rugs at the top or bottom of the stairs. If you do have throw rugs, attach them to the floor with carpet tape.  Make sure that you have a light switch at the top of the stairs and the bottom of the stairs. If you do not have them, ask someone to add them for you. What else can I do to help prevent falls?  Wear shoes that:  Do not have high heels.  Have rubber bottoms.  Are  comfortable and fit you well.  Are closed at the toe. Do not wear sandals.  If you use a stepladder:  Make sure that it is fully opened. Do not climb a closed stepladder.  Make sure that both sides of the stepladder are locked into place.  Ask someone to hold it for you, if possible.  Clearly mark and make sure that you can see:  Any grab bars or handrails.  First and last steps.  Where the edge of each step is.  Use tools that help you move around (mobility aids) if they are needed. These include:  Canes.  Walkers.  Scooters.  Crutches.  Turn on the lights when you go into a dark area. Replace any light bulbs as soon as they burn out.  Set up your furniture so you have a clear path. Avoid moving your furniture around.  If any of your floors are uneven, fix them.  If there are any pets around you, be aware of where they are.  Review your medicines with your doctor. Some medicines can make you feel dizzy. This can increase your chance of falling. Ask your doctor what other things that you can do to help prevent falls. This information is not intended to replace advice given to you by your health care provider. Make sure you discuss any questions you have with your health care provider. Document Released: 12/24/2008 Document Revised: 08/05/2015 Document Reviewed: 04/03/2014 Elsevier Interactive Patient Education  2017 [employer].

## 2017-02-16 NOTE — Progress Notes (Signed)
Subjective:   Christina Mcgee is a 66 y.o. female who presents for an Initial Medicare Annual Wellness Visit.  Review of Systems    N/A  Cardiac Risk Factors include: advanced age (>70men, >29 women);obesity (BMI >30kg/m2);sedentary lifestyle     Objective:    Today's Vitals   02/16/17 0924  BP: 126/78  Pulse: 66  Temp: 97.8 F (36.6 C)  TempSrc: Oral  SpO2: 100%  Weight: 205 lb 8 oz (93.2 kg)  Height: 5\' 5"  (1.651 m)   Body mass index is 34.2 kg/m.  Advanced Directives 02/16/2017 02/16/2016 11/13/2014  Does Patient Have a Medical Advance Directive? Yes No Yes  Type of Paramedic of [place];Living will - -  Copy of [place] in Chart? No - copy requested - No - copy requested  Would patient like information on creating a medical advance directive? - No - Patient declined -    Current Medications (verified) Outpatient Encounter Medications as of 02/16/2017  Medication Sig  . Cholecalciferol (VITAMIN D) 400 UNIT/ML LIQD Take by mouth.  . citalopram (CELEXA) 20 MG tablet Take 1 tablet (20 mg total) by mouth daily.  Marland Kitchen levothyroxine (SYNTHROID, LEVOTHROID) 88 MCG tablet TAKE 1 TABLET BY MOUTH EVERY MORNING BEFORE BREAKFAST  . methocarbamol (ROBAXIN) 500 MG tablet Take 1 tablet (500 mg total) by mouth every 6 (six) hours as needed for muscle spasms.  . Probiotic Product (PROBIOTIC DAILY PO) Take by mouth daily.   No facility-administered encounter medications on file as of 02/16/2017.     Allergies (verified) Patient has no known allergies.   History: Past Medical History:  Diagnosis Date  . Allergy   . Anxiety   . Cervical dysplasia age 39  . Fatty liver disease, nonalcoholic    s/p GI consultation; [facility] in [place].  . Hypothyroid    Past Surgical History:  Procedure Laterality Date  . CESAREAN SECTION    . COLPOSCOPY    . GYNECOLOGIC CRYOSURGERY  age 77   Family History  Problem Relation Age of Onset  . Breast  cancer Mother 55  . Leukemia Mother   . Cancer Mother        leukemia; breast cancer at age 36.  Marland Kitchen Hypertension Father   . Heart disease Father 59       CHF  . Hyperlipidemia Father   . Cancer Sister        Bladder  . Hyperlipidemia Brother   . Hypertension Brother   . Cancer Maternal Grandmother        BREAST  . Heart disease Maternal Grandfather   . Hyperlipidemia Maternal Grandfather   . Hypertension Maternal Grandfather   . Cancer Paternal Grandmother        BREAST   Social History   Socioeconomic History  . Marital status: Married    Spouse name: None  . Number of children: 3  . Years of education: None  . Highest education level: Some college, no degree  Social Needs  . Financial resource strain: Not hard at all  . Food insecurity - worry: Never true  . Food insecurity - inability: Never true  . Transportation needs - medical: No  . Transportation needs - non-medical: No  Occupational History  . None  Tobacco Use  . Smoking status: Former Research scientist (life sciences)  . Smokeless tobacco: Never Used  Substance and Sexual Activity  . Alcohol use: Yes    Alcohol/week: 1.8 oz    Types: 3 Standard drinks  or equivalent per week  . Drug use: No  . Sexual activity: Yes    Birth control/protection: Post-menopausal  Other Topics Concern  . None  Social History Narrative   Marital status: married x 41 years; happily married; no abuse       Children:  3 children (37, 64, 79); 4 grandchildren in [place] within 30 minutes.       Lives: with husband, youngest son.      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 Wprking 2 days per week; [employer] scattered.      Tobacco; none       Alcohol:  Socially sporadically; once per week.      Exercise:  None in 2017      Seatbelt:  100%      Guns: yes; unloaded.      Advanced Directives: FULL CODE.  YES.     Tobacco Counseling Counseling given: Not Answered   Clinical Intake:  Pre-visit preparation completed:  Yes  Pain : No/denies pain     Nutritional Status: BMI > 30  Obese Nutritional Risks: None(Patient has a lot of gas at times) Diabetes: No  Activities of Daily Living: Independent Ambulation: Independent with device- listed below Home Assistive Devices/Equipment: Contact lenses, Eyeglasses Medication Administration: Independent Home Management: Independent  Barriers to Care Management & Learning: None  Do you feel unsafe in your current relationship?: No Do you feel physically threatened by others?: No Anyone hurting you at home, work, or school?: No Unable to ask?: No  How often do you need to have someone help you when you read instructions, pamphlets, or other written materials from your doctor or pharmacy?: 1 - Never What is the last grade level you completed in school?: Some college  Interpreter Needed?: No  Information entered by :: Andrez Grime, LPN   Activities of Daily Living In your present state of health, do you have any difficulty performing the following activities: 02/16/2017  Hearing? Y  Comment Patient states that she has slight hearing difficulty   Vision? N  Difficulty concentrating or making decisions? Y  Comment Patient has issues with remembering things  Walking or climbing stairs? N  Dressing or bathing? N  Doing errands, shopping? N  Preparing Food and eating ? N  Using the Toilet? N  In the past six months, have you accidently leaked urine? N  Do you have problems with loss of bowel control? Y  Comment Patient has loss of bowel control when she has issues with gas.   Managing your Medications? N  Managing your Finances? N  Housekeeping or managing your Housekeeping? N  Some recent data might be hidden    Timed Get Up and Go Performed yes, passed, completed within 30 seconds  Immunizations and Health Maintenance Immunization History  Administered Date(s) Administered  . Influenza,inj,Quad PF,6+ Mos 11/13/2014, 02/16/2016, 12/20/2016   . Pneumococcal Conjugate-13 02/16/2016   There are no preventive care reminders to display for this patient.  Patient Care Team: Wardell Honour, MD as PCP - General (Family Medicine) Idelle Leech, OD (Optometry)  Indicate any recent Medical Services you may have received from other than [facility] providers in the past year (date may be approximate).     Assessment:   This is a routine wellness examination for [place].   Hearing/Vision screen Hearing Screening Comments: Patient had a hearing exam years ago and she had borderline issues.  Vision Screening Comments: Patient sees Dr. Lanny Hurst  Nice for her routine eye exams once a year.   Dietary issues and exercise activities discussed: Current Exercise Habits: The patient does not participate in regular exercise at present, Exercise limited by: None identified  Goals    . Exercise 3x per week (30 min per time)     Patient states that she wants to start exercising more and lose weight and get to around 175 lbs.       Depression Screen PHQ 2/9 Scores 02/16/2017 12/20/2016 02/16/2016 09/29/2015 11/13/2014 07/28/2013  PHQ - 2 Score 0 0 0 0 0 0    Fall Risk Fall Risk  02/16/2017 12/20/2016 02/16/2016 09/29/2015 07/28/2013  Falls in the past year? No No No No No    Is the patient's home free of loose throw rugs in walkways, pet beds, electrical cords, etc?   yes      Grab bars in the bathroom? no      Handrails on the stairs?   yes      Adequate lighting?   yes  Cognitive Function:     6CIT Screen 02/16/2017  What Year? 0 points  What month? 0 points  What time? 0 points  Count back from 20 0 points  Months in reverse 0 points  Repeat phrase 2 points  Total Score 2    Screening Tests Health Maintenance  Topic Date Due  . MAMMOGRAM  02/23/2017 (Originally 06/22/2014)  . PNA vac Low Risk Adult (2 of 2 - PPSV23) 02/23/2017 (Originally 02/15/2017)  . COLONOSCOPY  05/10/2017 (Originally 10/06/2000)  . TETANUS/TDAP  05/09/2020  . INFLUENZA  VACCINE  Completed  . DEXA SCAN  Completed  . Hepatitis C Screening  Completed    Cancer Screenings: Lung:  Low Dose CT Chest recommended if Age 13-80 years, 30 pack-year currently smoking OR have quit w/in 15years. Patient does not qualify. Breast:  Up to date on Mammogram? No, mammogram is scheduled for today. Up to date of Bone Density/Dexa? Yes, completed 06/27/2012 Colorectal: not up to date, scheduled for February 2019.  Additional Screenings:  Hepatitis B/HIV/Syphillis:not indicated Hepatitis C Screening: completed 10/20/2013     Plan:   I have personally reviewed and noted the following in the patient's chart:   . Medical and social history . Use of alcohol, tobacco or illicit drugs  . Current medications and supplements . Functional ability and status . Nutritional status . Physical activity . Advanced directives . List of other physicians . Hospitalizations, surgeries, and ER visits in previous 12 months . Vitals . Screenings to include cognitive, depression, and falls . Referrals and appointments  In addition, I have reviewed and discussed with patient certain preventive protocols, quality metrics, and best practice recommendations. A written personalized care plan for preventive services as well as general preventive health recommendations were provided to patient.  Patient states her mammogram is scheduled for today.  Patient states her colonoscopy is scheduled for February 2019. Patient declined Pneumovax 23 today. She will get this at her office visit next week.   1. Encounter for Medicare annual wellness exam   Andrez Grime, LPN   31/06/9700

## 2017-02-20 ENCOUNTER — Encounter: Payer: Medicare HMO | Admitting: Family Medicine

## 2017-02-21 ENCOUNTER — Encounter: Payer: Medicare HMO | Admitting: Family Medicine

## 2017-02-22 ENCOUNTER — Other Ambulatory Visit: Payer: Self-pay | Admitting: Family Medicine

## 2017-02-22 DIAGNOSIS — F329 Major depressive disorder, single episode, unspecified: Secondary | ICD-10-CM

## 2017-02-22 DIAGNOSIS — F419 Anxiety disorder, unspecified: Principal | ICD-10-CM

## 2017-02-22 DIAGNOSIS — F32A Depression, unspecified: Secondary | ICD-10-CM

## 2017-02-22 DIAGNOSIS — E034 Atrophy of thyroid (acquired): Secondary | ICD-10-CM

## 2017-02-23 ENCOUNTER — Encounter: Payer: Medicare HMO | Admitting: Family Medicine

## 2017-02-27 DIAGNOSIS — S161XXA Strain of muscle, fascia and tendon at neck level, initial encounter: Secondary | ICD-10-CM | POA: Diagnosis not present

## 2017-03-20 DIAGNOSIS — L649 Androgenic alopecia, unspecified: Secondary | ICD-10-CM | POA: Diagnosis not present

## 2017-03-20 DIAGNOSIS — Z85828 Personal history of other malignant neoplasm of skin: Secondary | ICD-10-CM | POA: Diagnosis not present

## 2017-04-04 DIAGNOSIS — H5203 Hypermetropia, bilateral: Secondary | ICD-10-CM | POA: Diagnosis not present

## 2017-04-04 DIAGNOSIS — Z0100 Encounter for examination of eyes and vision without abnormal findings: Secondary | ICD-10-CM | POA: Diagnosis not present

## 2017-04-11 ENCOUNTER — Encounter: Payer: Medicare HMO | Admitting: Family Medicine

## 2017-04-23 ENCOUNTER — Ambulatory Visit
Admission: RE | Admit: 2017-04-23 | Discharge: 2017-04-23 | Disposition: A | Discharge status: Home / Self Care | Payer: Medicare HMO | Source: Ambulatory Visit | Attending: Gastroenterology | Admitting: Gastroenterology

## 2017-04-23 ENCOUNTER — Ambulatory Visit: Payer: Medicare HMO | Admitting: Anesthesiology

## 2017-04-23 ENCOUNTER — Encounter
Admission: RE | Disposition: A | Discharge status: Home / Self Care | Payer: Self-pay | Source: Ambulatory Visit | Attending: Gastroenterology

## 2017-04-23 DIAGNOSIS — Z87891 Personal history of nicotine dependence: Secondary | ICD-10-CM | POA: Insufficient documentation

## 2017-04-23 DIAGNOSIS — Z1211 Encounter for screening for malignant neoplasm of colon: Secondary | ICD-10-CM | POA: Insufficient documentation

## 2017-04-23 DIAGNOSIS — E039 Hypothyroidism, unspecified: Secondary | ICD-10-CM | POA: Diagnosis not present

## 2017-04-23 DIAGNOSIS — K635 Polyp of colon: Secondary | ICD-10-CM | POA: Diagnosis not present

## 2017-04-23 DIAGNOSIS — D123 Benign neoplasm of transverse colon: Secondary | ICD-10-CM | POA: Insufficient documentation

## 2017-04-23 DIAGNOSIS — K621 Rectal polyp: Secondary | ICD-10-CM | POA: Diagnosis not present

## 2017-04-23 DIAGNOSIS — Z6833 Body mass index (BMI) 33.0-33.9, adult: Secondary | ICD-10-CM | POA: Diagnosis not present

## 2017-04-23 DIAGNOSIS — K760 Fatty (change of) liver, not elsewhere classified: Secondary | ICD-10-CM | POA: Diagnosis not present

## 2017-04-23 DIAGNOSIS — Z79899 Other long term (current) drug therapy: Secondary | ICD-10-CM | POA: Diagnosis not present

## 2017-04-23 DIAGNOSIS — F419 Anxiety disorder, unspecified: Secondary | ICD-10-CM | POA: Insufficient documentation

## 2017-04-23 DIAGNOSIS — R69 Illness, unspecified: Secondary | ICD-10-CM | POA: Diagnosis not present

## 2017-04-23 DIAGNOSIS — K5730 Diverticulosis of large intestine without perforation or abscess without bleeding: Secondary | ICD-10-CM | POA: Diagnosis not present

## 2017-04-23 DIAGNOSIS — D128 Benign neoplasm of rectum: Secondary | ICD-10-CM | POA: Diagnosis not present

## 2017-04-23 HISTORY — PX: COLONOSCOPY WITH PROPOFOL: SHX5780

## 2017-04-23 SURGERY — COLONOSCOPY WITH PROPOFOL
Anesthesia: General

## 2017-04-23 MED ORDER — SODIUM CHLORIDE 0.9 % IV SOLN
INTRAVENOUS | Status: DC
  Administered 2017-04-23: 1000 mL via INTRAVENOUS

## 2017-04-23 MED ORDER — MIDAZOLAM HCL 2 MG/2ML IJ SOLN
INTRAMUSCULAR | Status: AC
  Filled 2017-04-23: qty 2

## 2017-04-23 MED ORDER — PROPOFOL 500 MG/50ML IV EMUL
INTRAVENOUS | Status: AC
  Filled 2017-04-23: qty 100

## 2017-04-23 MED ORDER — LIDOCAINE 2% (20 MG/ML) 5 ML SYRINGE
INTRAMUSCULAR | Status: DC | PRN
  Administered 2017-04-23: 25 mg via INTRAVENOUS

## 2017-04-23 MED ORDER — LIDOCAINE HCL (PF) 1 % IJ SOLN
INTRAMUSCULAR | Status: AC
  Administered 2017-04-23: 0.3 mL via INTRADERMAL
  Filled 2017-04-23: qty 2

## 2017-04-23 MED ORDER — LIDOCAINE HCL (PF) 1 % IJ SOLN
2.0000 mL | Freq: Once | INTRAMUSCULAR | Status: AC
  Administered 2017-04-23: 0.3 mL via INTRADERMAL

## 2017-04-23 MED ORDER — LIDOCAINE HCL (PF) 2 % IJ SOLN
INTRAMUSCULAR | Status: AC
  Filled 2017-04-23: qty 10

## 2017-04-23 MED ORDER — MIDAZOLAM HCL 5 MG/5ML IJ SOLN
INTRAMUSCULAR | Status: DC | PRN
  Administered 2017-04-23: 2 mg via INTRAVENOUS

## 2017-04-23 MED ORDER — PROPOFOL 500 MG/50ML IV EMUL
INTRAVENOUS | Status: DC | PRN
  Administered 2017-04-23: 120 ug/kg/min via INTRAVENOUS

## 2017-04-23 MED ORDER — GLYCOPYRROLATE 0.2 MG/ML IJ SOLN
INTRAMUSCULAR | Status: AC
  Filled 2017-04-23: qty 1

## 2017-04-23 MED ORDER — PROPOFOL 10 MG/ML IV BOLUS
INTRAVENOUS | Status: DC | PRN
  Administered 2017-04-23 (×2): 50 mg via INTRAVENOUS

## 2017-04-23 NOTE — H&P (Signed)
Outpatient short stay form Pre-procedure 04/23/2017 1:39 PM Lollie Sails MD  Primary Physician: Nyoka Cowden MD  Reason for visit: Colonoscopy  History of present illness: Patient is a 67 year old female presenting today as above.  Her last colonoscopy was in 2008 with no finding of polyps at that time.  No family history of colon cancer or colon polyps.  She tolerated her prep well.  She takes no aspirin or blood thinning agent.    Current Facility-Administered Medications:  .  0.9 %  sodium chloride infusion, , Intravenous, Continuous, Lollie Sails, MD, Last Rate: 20 mL/hr at 04/23/17 1219, 1,000 mL at 04/23/17 1219  Medications Prior to Admission  Medication Sig Dispense Refill Last Dose  . azelastine (ASTELIN) 0.1 % nasal spray Place into both nostrils 2 (two) times daily. Use in each nostril as directed     . Cholecalciferol (VITAMIN D) 400 UNIT/ML LIQD Take by mouth.   Taking  . citalopram (CELEXA) 20 MG tablet TAKE 1 TABLET BY MOUTH DAILY 90 tablet 0   . levothyroxine (SYNTHROID, LEVOTHROID) 88 MCG tablet TAKE 1 TABLET BY MOUTH EVERY MORNING BEFORE BREAKFAST 90 tablet 0   . methocarbamol (ROBAXIN) 500 MG tablet Take 1 tablet (500 mg total) by mouth every 6 (six) hours as needed for muscle spasms. (Patient not taking: Reported on 04/20/2017) 45 tablet 0 Completed Course at Unknown time  . Probiotic Product (PROBIOTIC DAILY PO) Take by mouth daily.        No Known Allergies   Past Medical History:  Diagnosis Date  . Allergy   . Anxiety   . Cervical dysplasia age 71  . Fatty liver disease, nonalcoholic    s/p GI consultation; [facility] in [place].  . Hypothyroid     Review of systems:      Physical Exam    Heart and lungs: Not rub or gallop, lungs are bilaterally clear.    HEENT: Normocephalic atraumatic eyes are anicteric    Other:    Pertinant exam for procedure: Soft nontender nondistended bowel sounds positive normoactive.    Planned  proceedures: Colonoscopy and indicated procedures. I have discussed the risks benefits and complications of procedures to include not limited to bleeding, infection, perforation and the risk of sedation and the patient wishes to proceed.    Lollie Sails, MD Gastroenterology 04/23/2017  1:39 PM

## 2017-04-23 NOTE — Op Note (Addendum)
[facility] Gastroenterology Patient Name: Christina Mcgee Procedure Date: 04/23/2017 12:15 PM MRN: [id number] Account #: [id number] Date of Birth: 24-Jun-1950 Admit Type: Outpatient Age: 67 Room: [facility] ENDO ROOM 1 Gender: Female Note Status: Finalized Procedure:            Colonoscopy Indications:          Screening for colorectal malignant neoplasm Providers:            Lollie Sails, MD Referring MD:         Renette Butters. Tamala Julian, MD (Referring MD) Medicines:            Monitored Anesthesia Care Complications:        No immediate complications. Procedure:            Pre-Anesthesia Assessment:                       - ASA Grade Assessment: II - A patient with mild                        systemic disease.                       After obtaining informed consent, the colonoscope was                        passed under direct vision. Throughout the procedure,                        the patient's blood pressure, pulse, and oxygen                        saturations were monitored continuously. The Olympus                        PCF-H180AL colonoscope ( S#: [id number] ) was introduced                        through the anus and advanced to the the cecum,                        identified by appendiceal orifice and ileocecal valve.                        The colonoscopy was performed without difficulty. The                        patient tolerated the procedure well. The quality of                        the bowel preparation was good. Findings:      Two sessile polyps were found in the proximal transverse colon. The       polyps were 1 to 2 mm in size. These polyps were removed with a cold       biopsy forceps. Resection and retrieval were complete.      A less than 1 mm polyp was found in the rectum. The polyp was sessile.       The polyp was removed with a cold biopsy forceps. Resection and       retrieval were complete.      Multiple small-mouthed diverticula  were found in the  sigmoid colon and       descending colon.      The digital rectal exam was normal. Impression:           - Two 1 to 2 mm polyps in the proximal transverse                        colon, removed with a cold biopsy forceps. Resected and                        retrieved.                       - One less than 1 mm polyp in the rectum, removed with                        a cold biopsy forceps. Resected and retrieved.                       - Diverticulosis in the sigmoid colon and in the                        descending colon. Recommendation:       - Discharge patient to home.                       - Advance diet as tolerated. Procedure Code(s):    --- Professional ---                       [phone], Colonoscopy, flexible; with biopsy, single or                        multiple Diagnosis Code(s):    --- Professional ---                       Z12.11, Encounter for screening for malignant neoplasm                        of colon                       D12.3, Benign neoplasm of transverse colon (hepatic                        flexure or splenic flexure)                       K62.1, Rectal polyp                       K57.30, Diverticulosis of large intestine without                        perforation or abscess without bleeding CPT copyright 2016 American Medical Association. All rights reserved. The codes documented in this report are preliminary and upon coder review may  be revised to meet current compliance requirements. Lollie Sails, MD 04/23/2017 2:17:50 PM This report has been signed electronically. Number of Addenda: 0 Note Initiated On: 04/23/2017 12:15 PM Scope Withdrawal Time: 0 hours 9 minutes 24 seconds  Total Procedure Duration: 0 hours 26 minutes 52 seconds  [facility]

## 2017-04-23 NOTE — Transfer of Care (Signed)
Immediate Anesthesia Transfer of Care Note  Patient: Christina Mcgee  Procedure(s) Performed: COLONOSCOPY WITH PROPOFOL (N/A )  Patient Location: Endoscopy Unit  Anesthesia Type:General  Level of Consciousness: awake and alert   Airway & Oxygen Therapy: Patient Spontanous Breathing and Patient connected to nasal cannula oxygen  Post-op Assessment: Report given to RN and Post -op Vital signs reviewed and stable  Post vital signs: Reviewed  Last Vitals:  Vitals:   04/23/17 1419 04/23/17 1421  BP: 121/73 121/73  Pulse:  68  Resp:  (!) 21  Temp: (!) 36.3 C (!) 36.2 C  SpO2:  100%    Last Pain:  Vitals:   04/23/17 1419  TempSrc: Tympanic         Complications: No apparent anesthesia complications

## 2017-04-23 NOTE — Anesthesia Post-op Follow-up Note (Signed)
Anesthesia QCDR form completed.        

## 2017-04-23 NOTE — Anesthesia Preprocedure Evaluation (Signed)
Anesthesia Evaluation  Patient identified by MRN, date of birth, ID band Patient awake    Reviewed: Allergy & Precautions, H&P , NPO status , Patient's Chart, lab work & pertinent test results, reviewed documented beta blocker date and time   Airway Mallampati: II   Neck ROM: full    Dental  (+) Poor Dentition   Pulmonary neg pulmonary ROS, former smoker,    Pulmonary exam normal        Cardiovascular negative cardio ROS Normal cardiovascular exam Rhythm:regular Rate:Normal     Neuro/Psych negative neurological ROS  negative psych ROS   GI/Hepatic negative GI ROS, Neg liver ROS,   Endo/Other  negative endocrine ROSHypothyroidism Morbid obesity  Renal/GU negative Renal ROS  negative genitourinary   Musculoskeletal   Abdominal   Peds  Hematology negative hematology ROS (+)   Anesthesia Other Findings Past Medical History: No date: Allergy No date: Anxiety age 5: Cervical dysplasia No date: Fatty liver disease, nonalcoholic     Comment:  s/p GI consultation; [facility] in [place]. No date: Hypothyroid Past Surgical History: No date: CESAREAN SECTION No date: COLONOSCOPY No date: COLPOSCOPY age 67: GYNECOLOGIC CRYOSURGERY BMI    Body Mass Index:  33.12 kg/m     Reproductive/Obstetrics negative OB ROS                             Anesthesia Physical Anesthesia Plan  ASA: II  Anesthesia Plan: General   Post-op Pain Management:    Induction:   PONV Risk Score and Plan:   Airway Management Planned:   Additional Equipment:   Intra-op Plan:   Post-operative Plan:   Informed Consent: I have reviewed the patients History and Physical, chart, labs and discussed the procedure including the risks, benefits and alternatives for the proposed anesthesia with the patient or authorized representative who has indicated his/her understanding and acceptance.   Dental Advisory  Given  Plan Discussed with: CRNA  Anesthesia Plan Comments:         Anesthesia Quick Evaluation

## 2017-04-24 ENCOUNTER — Encounter: Payer: Self-pay | Admitting: Gastroenterology

## 2017-04-24 NOTE — Anesthesia Postprocedure Evaluation (Signed)
Anesthesia Post Note  Patient: Christina Mcgee  Procedure(s) Performed: COLONOSCOPY WITH PROPOFOL (N/A )  Patient location during evaluation: Endoscopy Anesthesia Type: General Level of consciousness: awake and alert Pain management: pain level controlled Vital Signs Assessment: post-procedure vital signs reviewed and stable Respiratory status: spontaneous breathing, nonlabored ventilation, respiratory function stable and patient connected to nasal cannula oxygen Cardiovascular status: blood pressure returned to baseline and stable Postop Assessment: no apparent nausea or vomiting Anesthetic complications: no     Last Vitals:  Vitals:   04/23/17 1421 04/23/17 1448  BP: 121/73 (!) 141/79  Pulse: 68   Resp: (!) 21   Temp: (!) 36.2 C   SpO2: 100%     Last Pain:  Vitals:   04/23/17 1419  TempSrc: Tympanic                 Precious Haws P[place]it[place]

## 2017-04-25 LAB — SURGICAL PATHOLOGY

## 2017-05-26 ENCOUNTER — Other Ambulatory Visit: Payer: Self-pay | Admitting: Family Medicine

## 2017-05-26 DIAGNOSIS — F419 Anxiety disorder, unspecified: Principal | ICD-10-CM

## 2017-05-26 DIAGNOSIS — F32A Depression, unspecified: Secondary | ICD-10-CM

## 2017-05-26 DIAGNOSIS — F329 Major depressive disorder, single episode, unspecified: Secondary | ICD-10-CM

## 2017-05-26 DIAGNOSIS — E034 Atrophy of thyroid (acquired): Secondary | ICD-10-CM

## 2017-05-28 NOTE — Telephone Encounter (Signed)
citalopram refill Last OV: 12/20/16--Has upcoming visit 07/04/17 Last Refill:02/26/17 Pharmacy:[facility] [address] PCP: Dr. Reginia Forts   Levothyroxine refill Last OV: 12/20/16  Last Refill:02/26/17 Pharmacy: [facility] [address] PCP: Dr Reginia Forts

## 2017-05-30 ENCOUNTER — Other Ambulatory Visit: Payer: Self-pay | Admitting: Family Medicine

## 2017-05-30 DIAGNOSIS — E034 Atrophy of thyroid (acquired): Secondary | ICD-10-CM

## 2017-05-30 DIAGNOSIS — F32A Depression, unspecified: Secondary | ICD-10-CM

## 2017-05-30 DIAGNOSIS — F419 Anxiety disorder, unspecified: Secondary | ICD-10-CM

## 2017-05-30 DIAGNOSIS — F329 Major depressive disorder, single episode, unspecified: Secondary | ICD-10-CM

## 2017-07-04 ENCOUNTER — Ambulatory Visit (INDEPENDENT_AMBULATORY_CARE_PROVIDER_SITE_OTHER): Payer: Medicare HMO | Admitting: Family Medicine

## 2017-07-04 ENCOUNTER — Encounter: Payer: Self-pay | Admitting: Family Medicine

## 2017-07-04 ENCOUNTER — Other Ambulatory Visit: Payer: Self-pay

## 2017-07-04 VITALS — BP 130/72 | HR 66 | Temp 98.0°F | Resp 16 | Ht 65.16 in | Wt 203.0 lb

## 2017-07-04 DIAGNOSIS — K760 Fatty (change of) liver, not elsewhere classified: Secondary | ICD-10-CM | POA: Diagnosis not present

## 2017-07-04 DIAGNOSIS — Z131 Encounter for screening for diabetes mellitus: Secondary | ICD-10-CM

## 2017-07-04 DIAGNOSIS — F329 Major depressive disorder, single episode, unspecified: Secondary | ICD-10-CM

## 2017-07-04 DIAGNOSIS — F419 Anxiety disorder, unspecified: Secondary | ICD-10-CM

## 2017-07-04 DIAGNOSIS — E6609 Other obesity due to excess calories: Secondary | ICD-10-CM

## 2017-07-04 DIAGNOSIS — E2839 Other primary ovarian failure: Secondary | ICD-10-CM | POA: Diagnosis not present

## 2017-07-04 DIAGNOSIS — F32A Depression, unspecified: Secondary | ICD-10-CM

## 2017-07-04 DIAGNOSIS — Z6833 Body mass index (BMI) 33.0-33.9, adult: Secondary | ICD-10-CM | POA: Diagnosis not present

## 2017-07-04 DIAGNOSIS — E034 Atrophy of thyroid (acquired): Secondary | ICD-10-CM | POA: Diagnosis not present

## 2017-07-04 DIAGNOSIS — Z23 Encounter for immunization: Secondary | ICD-10-CM | POA: Diagnosis not present

## 2017-07-04 DIAGNOSIS — E7800 Pure hypercholesterolemia, unspecified: Secondary | ICD-10-CM | POA: Diagnosis not present

## 2017-07-04 DIAGNOSIS — R69 Illness, unspecified: Secondary | ICD-10-CM | POA: Diagnosis not present

## 2017-07-04 DIAGNOSIS — Z0000 Encounter for general adult medical examination without abnormal findings: Secondary | ICD-10-CM

## 2017-07-04 MED ORDER — LEVOTHYROXINE SODIUM 88 MCG PO TABS: ORAL_TABLET | ORAL | 3 refills | Status: AC

## 2017-07-04 MED ORDER — ZOSTER VAC RECOMB ADJUVANTED 50 MCG/0.5ML IM SUSR: 0.5000 mL | Freq: Once | INTRAMUSCULAR | 1 refills | Status: AC

## 2017-07-04 MED ORDER — CITALOPRAM HYDROBROMIDE 20 MG PO TABS: 20.0000 mg | ORAL_TABLET | Freq: Every day | ORAL | 3 refills | Status: AC

## 2017-07-04 NOTE — Progress Notes (Signed)
Subjective:    Patient ID: Christina Mcgee, female    DOB: 07/12/1950, 67 y.o.   MRN: [id number]  07/04/2017  Annual Exam    HPI This 67 y.o. female presents for COMPLETE PHYSICAL EXAMINATION and follow-up of chronic medical conditions.  [place] EXAMINATION 02/16/2017. New glasses. Dentist every six motnhs.   Visual Acuity Screening   Right eye Left eye Both eyes  Without correction:     With correction: 20/20 20/20 20/20     BP Readings from Last 3 Encounters:  07/04/17 130/72  04/23/17 (!) 141/79  02/16/17 126/78   Wt Readings from Last 3 Encounters:  07/04/17 203 lb (92.1 kg)  04/23/17 199 lb (90.3 kg)  02/16/17 205 lb 8 oz (93.2 kg)   Immunization History  Administered Date(s) Administered  . Influenza,inj,Quad PF,6+ Mos 11/13/2014, 02/16/2016, 12/20/2016  . Pneumococcal Conjugate-13 02/16/2016  . Pneumococcal Polysaccharide-23 07/04/2017   Health Maintenance  Topic Date Due  . INFLUENZA VACCINE  10/11/2017  . MAMMOGRAM  02/17/2019  . TETANUS/TDAP  05/09/2020  . COLONOSCOPY  04/24/2027  . DEXA SCAN  Completed  . Hepatitis C Screening  Completed  . PNA vac Low Risk Adult  Completed    Review of Systems  Constitutional: Negative for activity change, appetite change, chills, diaphoresis, fatigue, fever and unexpected weight change.  HENT: Negative for congestion, dental problem, drooling, ear discharge, ear pain, facial swelling, hearing loss, mouth sores, nosebleeds, postnasal drip, rhinorrhea, sinus pressure, sneezing, sore throat, tinnitus, trouble swallowing and voice change.   Eyes: Negative for photophobia, pain, discharge, redness, itching and visual disturbance.  Respiratory: Negative for apnea, cough, choking, chest tightness, shortness of breath, wheezing and stridor.   Cardiovascular: Negative for chest pain, palpitations and leg swelling.  Gastrointestinal: Negative for abdominal distention, abdominal pain, anal bleeding, blood in stool,  constipation, diarrhea, nausea, rectal pain and vomiting.  Endocrine: Negative for cold intolerance, heat intolerance, polydipsia, polyphagia and polyuria.  Genitourinary: Negative for decreased urine volume, difficulty urinating, dyspareunia, dysuria, enuresis, flank pain, frequency, genital sores, hematuria, menstrual problem, pelvic pain, urgency, vaginal bleeding, vaginal discharge and vaginal pain.       Nocturia x 1.  Urinary leakage occasionally.  Musculoskeletal: Negative for arthralgias, back pain, gait problem, joint swelling, myalgias, neck pain and neck stiffness.  Skin: Negative for color change, pallor, rash and wound.  Allergic/Immunologic: Negative for environmental allergies, food allergies and immunocompromised state.  Neurological: Negative for dizziness, tremors, seizures, syncope, facial asymmetry, speech difficulty, weakness, light-headedness, numbness and headaches.  Hematological: Negative for adenopathy. Does not bruise/bleed easily.  Psychiatric/Behavioral: Negative for agitation, behavioral problems, confusion, decreased concentration, dysphoric mood, hallucinations, self-injury, sleep disturbance and suicidal ideas. The patient is not nervous/anxious and is not hyperactive.        Bedtime 10-11; wakes up 6-7.    Past Medical History:  Diagnosis Date  . Allergy   . Anxiety   . Cervical dysplasia age 12  . Fatty liver disease, nonalcoholic    s/p GI consultation; [facility] in [place].  . Hypothyroid    Past Surgical History:  Procedure Laterality Date  . CESAREAN SECTION    . COLONOSCOPY    . COLONOSCOPY WITH PROPOFOL N/A 04/23/2017   Procedure: COLONOSCOPY WITH PROPOFOL;  Surgeon: Lollie Sails, MD;  Location[facility] ENDOSCOPY;  Service: Endoscopy;  Laterality: N/A;  . COLPOSCOPY    . GYNECOLOGIC CRYOSURGERY  age 67   No Known Allergies Current Outpatient Medications on File Prior to Visit  Medication Sig Dispense  Refill  . Cholecalciferol (VITAMIN D)  400 UNIT/ML LIQD Take by mouth.    . Probiotic Product (PROBIOTIC DAILY PO) Take by mouth daily.     No current facility-administered medications on file prior to visit.    Social History   Socioeconomic History  . Marital status: Married    Spouse name: Not on file  . Number of children: 3  . Years of education: Not on file  . Highest education level: Some college, no degree  Occupational History  . Not on file  Social Needs  . Financial resource strain: Not hard at all  . Food insecurity:    Worry: Never true    Inability: Never true  . Transportation needs:    Medical: No    Non-medical: No  Tobacco Use  . Smoking status: Former Research scientist (life sciences)  . Smokeless tobacco: Never Used  Substance and Sexual Activity  . Alcohol use: Yes    Alcohol/week: 1.8 oz    Types: 3 Standard drinks or equivalent per week  . Drug use: No  . Sexual activity: Yes    Birth control/protection: Post-menopausal  Lifestyle  . Physical activity:    Days per week: 0 days    Minutes per session: 0 min  . Stress: Not at all  Relationships  . Social connections:    Talks on phone: More than three times a week    Gets together: More than three times a week    Attends religious service: More than 4 times per year    Active member of club or organization: No    Attends meetings of clubs or organizations: Never    Relationship status: Married  . Intimate partner violence:    Fear of current or ex partner: No    Emotionally abused: No    Physically abused: No    Forced sexual activity: No  Other Topics Concern  . Not on file  Social History Narrative   Marital status: married x 43 years; happily married; no abuse       Children:  3 children (41, 52, 76); 4 grandchildren in [place] within 30 minutes.       Lives: with husband, youngest son.      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 Wprking 2 days per week; [employer] scattered.      Tobacco; none       Alcohol:  Socially  sporadically; once per week.      Exercise:  None in 2019.      Seatbelt:  100%      Guns: yes; unloaded.      ADLs: independent with ADLs.      Advanced Directives: FULL CODE.  YES.    Family History  Problem Relation Age of Onset  . Breast cancer Mother 54  . Leukemia Mother   . Cancer Mother        leukemia; breast cancer at age 53.  Marland Kitchen Hypertension Father   . Heart disease Father 58       CHF  . Hyperlipidemia Father   . Cancer Sister        Bladder  . Hyperlipidemia Brother   . Hypertension Brother   . Cancer Maternal Grandmother        BREAST  . Heart disease Maternal Grandfather   . Hyperlipidemia Maternal Grandfather   . Hypertension Maternal Grandfather   . Cancer Paternal Grandmother        BREAST  Objective:    BP 130/72   Pulse 66   Temp 98 F (36.7 C) (Oral)   Resp 16   Ht 5' 5.16" (1.655 m)   Wt 203 lb (92.1 kg)   SpO2 97%   BMI 33.62 kg/m  Physical Exam  Constitutional: She is oriented to person, place, and time. She appears well-developed and well-nourished. No distress.  HENT:  Head: Normocephalic and atraumatic.  Right Ear: External ear normal.  Left Ear: External ear normal.  Nose: Nose normal.  Mouth/Throat: Oropharynx is clear and moist.  Eyes: Pupils are equal, round, and reactive to light. Conjunctivae and EOM are normal.  Neck: Normal range of motion and full passive range of motion without pain. Neck supple. No JVD present. Carotid bruit is not present. No thyromegaly present.  Cardiovascular: Normal rate, regular rhythm and normal heart sounds. Exam reveals no gallop and no friction rub.  No murmur heard. Pulmonary/Chest: Effort normal and breath sounds normal. She has no wheezes. She has no rales.  Abdominal: Soft. Bowel sounds are normal. She exhibits no distension and no mass. There is no tenderness. There is no rebound and no guarding.  Musculoskeletal:       Right shoulder: Normal.       Left shoulder: Normal.        Cervical back: Normal.  Lymphadenopathy:    She has no cervical adenopathy.  Neurological: She is alert and oriented to person, place, and time. She has normal reflexes. No cranial nerve deficit. She exhibits normal muscle tone. Coordination normal.  Skin: Skin is warm and dry. No rash noted. She is not diaphoretic. No erythema. No pallor.  Psychiatric: She has a normal mood and affect. Her behavior is normal. Judgment and thought content normal.  Nursing note and vitals reviewed.  No results found. Depression screen [facility] 2/9 07/04/2017 02/16/2017 12/20/2016 02/16/2016 09/29/2015  Decreased Interest 0 0 0 0 0  Down, Depressed, Hopeless 0 0 0 0 0  PHQ - 2 Score 0 0 0 0 0   Fall Risk  07/04/2017 02/16/2017 12/20/2016 02/16/2016 09/29/2015  Falls in the past year? No No No No No    Functional Status Survey: Is the patient deaf or have difficulty hearing?: No Does the patient have difficulty seeing, even when wearing glasses/contacts?: No Does the patient have difficulty concentrating, remembering, or making decisions?: No Does the patient have difficulty walking or climbing stairs?: No Does the patient have difficulty dressing or bathing?: No Does the patient have difficulty doing errands alone such as visiting a doctor's office or shopping?: No     Assessment & Plan:   1. Routine physical examination   2. Hypothyroidism due to acquired atrophy of thyroid   3. Fatty liver disease, nonalcoholic   4. Anxiety and depression   5. Pure hypercholesterolemia   6. Screening for diabetes mellitus   7. Estrogen deficiency   8. Class 1 obesity due to excess calories with serious comorbidity and body mass index (BMI) of 33.0 to 33.9 in adult    -anticipatory guidance provided --- exercise, weight loss, safe driving practices, aspirin 81mg  daily. -obtain age appropriate screening labs and labs for chronic disease management. -moderate fall risk; undergoing treatment for depression; no evidence of  hearing loss.  Discussed advanced directives and living will; also discussed end of life issues including code status.  -s/p Pneumovax; refer for bone density scan. -Hypothyroidism: controlled; obtain labs; continue current medications; refill provided. -anxiety and depression; controlled; refill of citalopram provided. -  Hypercholesterolemia: stable; obtain labs;  I recommend weight loss, exercise, and low-cholesterol low-fat food choices.  I recommend limiting red meat to once per week; I recommend limiting fried foods to once per month. -NASH: recommend weight loss, low-fat foods; avoid alcohol and tylenol containing products. -Obesity: Recommend weight loss, exercise for 30-60 minutes five days per week; recommend 1200 kcal restriction per day with a minimum of 60 grams of protein per day.  Eat 3 meals per day. Do not skip meals. Consider having a protein shake as a meal replacement to aid with eliminating meal skipping. Look for products with <220 calories, <7 gm sugar, and 20-30 gm protein.  Eat breakfast within 2 hours of getting up.   Make  your plate non-starchy vegetables,  protein, and  carbohydrates at lunch and dinner.   Aim for at least 64 oz. of calorie-free beverages daily (water, Crystal Light, diet green tea, etc.). Eliminate any sugary beverages such as regular soda, sweet tea, or fruit juice.   Pay attention to hunger and fullness cues.  Stop eating once you feel satisfied; don't wait until you feel full, stuffed, or sick from eating.  Choose lean meats and low fat/fat free dairy products.  Choose foods high in fiber such as fruits, vegetables, and whole grains (brown rice, whole wheat pasta, whole wheat bread, etc.).  Limit foods with added sugar to <7 gm per serving.  Always eat in the kitchen/dining room.  Never eat in the bedroom or in front of the TV.    Orders Placed This Encounter  Procedures  . DG Bone Density    Standing Status:   Future    Standing  Expiration Date:   09/04/2018    Order Specific Question:   Reason for Exam (SYMPTOM  OR DIAGNOSIS REQUIRED)    Answer:   estrogen deficiency    Order Specific Question:   Preferred imaging location?    Answer:   External  . Pneumococcal polysaccharide vaccine 23-valent greater than or equal to 2yo subcutaneous/IM  . CBC with Differential/Platelet  . Comprehensive metabolic panel    Order Specific Question:   Has the patient fasted?    Answer:   No  . TSH  . Lipid panel    Order Specific Question:   Has the patient fasted?    Answer:   No  . Urinalysis, dipstick only  . T4, free   Meds ordered this encounter  Medications  . Zoster Vaccine Adjuvanted [facility]) injection    Sig: Inject 0.5 mLs into the muscle once for 1 dose.    Dispense:  0.5 mL    Refill:  1  . citalopram (CELEXA) 20 MG tablet    Sig: Take 1 tablet (20 mg total) by mouth daily.    Dispense:  90 tablet    Refill:  3  . levothyroxine (SYNTHROID, LEVOTHROID) 88 MCG tablet    Sig: TAKE 1 TABLET BY MOUTH EVERY DAY BEFORE BREAKFAST    Dispense:  90 tablet    Refill:  3    Return in about 1 year (around 07/05/2018) for complete physical examiniation.   Kristi Elayne Guerin, M.D. Primary Care at [facility] previously [facility] [address] [place], [place]  [phone] phone [phone] fax

## 2017-07-04 NOTE — Patient Instructions (Addendum)
We recommend that you schedule a BONE DENSITY SCAN for osteoporosis screening.  [place] [phone]  [place] --- STARTING IN AUGUST 2019.   IF you received an x-ray today, you will receive an invoice from [facility] Radiology. Please contact [facility] at [phone] with questions or concerns regarding your invoice.   IF you received labwork today, you will receive an invoice from [facility]. Please contact [facility] at [phone] with questions or concerns regarding your invoice.   Our billing staff will not be able to assist you with questions regarding bills from these companies.  You will be contacted with the lab results as soon as they are available. The fastest way to get your results is to activate your My Chart account. Instructions are located on the last page of this paperwork. If you have not heard from [place] regarding the results in 2 weeks, please contact this office.      Preventive Care 100 Years and Older, Female Preventive care refers to lifestyle choices and visits with your health care provider that can promote health and wellness. What does preventive care include?  A yearly physical exam. This is also called an annual well check.  Dental exams once or twice a year.  Routine eye exams. Ask your health care provider how often you should have your eyes checked.  Personal lifestyle choices, including: ? Daily care of your teeth and gums. ? Regular physical activity. ? Eating a healthy diet. ? Avoiding tobacco and drug use. ? Limiting alcohol use. ? Practicing safe sex. ? Taking low-dose aspirin every day. ? Taking vitamin and mineral supplements as recommended by your health care provider. What happens during an annual well check? The services and screenings done by your health care provider during your annual well check will depend on your age, overall health, lifestyle risk factors, and family history of  disease. Counseling Your health care provider may ask you questions about your:  Alcohol use.  Tobacco use.  Drug use.  Emotional well-being.  Home and relationship well-being.  Sexual activity.  Eating habits.  History of falls.  Memory and ability to understand (cognition).  Work and work Statistician.  Reproductive health.  Screening You may have the following tests or measurements:  Height, weight, and BMI.  Blood pressure.  Lipid and cholesterol levels. These may be checked every 5 years, or more frequently if you are over 21 years old.  Skin check.  Lung cancer screening. You may have this screening every year starting at age 66 if you have a 30-pack-year history of smoking and currently smoke or have quit within the past 15 years.  Fecal occult blood test (FOBT) of the stool. You may have this test every year starting at age 24.  Flexible sigmoidoscopy or colonoscopy. You may have a sigmoidoscopy every 5 years or a colonoscopy every 10 years starting at age 20.  Hepatitis C blood test.  Hepatitis B blood test.  Sexually transmitted disease (STD) testing.  Diabetes screening. This is done by checking your blood sugar (glucose) after you have not eaten for a while (fasting). You may have this done every 1-3 years.  Bone density scan. This is done to screen for osteoporosis. You may have this done starting at age 64.  Mammogram. This may be done every 1-2 years. Talk to your health care provider about how often you should have regular mammograms.  Talk with your health care provider about your test results,  treatment options, and if necessary, the need for more tests. Vaccines Your health care provider may recommend certain vaccines, such as:  Influenza vaccine. This is recommended every year.  Tetanus, diphtheria, and acellular pertussis (Tdap, Td) vaccine. You may need a Td booster every 10 years.  Varicella vaccine. You may need this if you have  not been vaccinated.  Zoster vaccine. You may need this after age 21.  Measles, mumps, and rubella (MMR) vaccine. You may need at least one dose of MMR if you were born in 1957 or later. You may also need a second dose.  Pneumococcal 13-valent conjugate (PCV13) vaccine. One dose is recommended after age 39.  Pneumococcal polysaccharide (PPSV23) vaccine. One dose is recommended after age 72.  Meningococcal vaccine. You may need this if you have certain conditions.  Hepatitis A vaccine. You may need this if you have certain conditions or if you travel or work in places where you may be exposed to hepatitis A.  Hepatitis B vaccine. You may need this if you have certain conditions or if you travel or work in places where you may be exposed to hepatitis B.  Haemophilus influenzae type b (Hib) vaccine. You may need this if you have certain conditions.  Talk to your health care provider about which screenings and vaccines you need and how often you need them. This information is not intended to replace advice given to you by your health care provider. Make sure you discuss any questions you have with your health care provider. Document Released: 03/26/2015 Document Revised: 11/17/2015 Document Reviewed: 12/29/2014 Elsevier Interactive Patient Education  [employer].

## 2017-07-05 LAB — COMPREHENSIVE METABOLIC PANEL
A/G RATIO: 1.8 (ref 1.2–2.2)
ALT: 29 IU/L (ref 0–32)
AST: 27 IU/L (ref 0–40)
Albumin: 4.6 g/dL (ref 3.6–4.8)
Alkaline Phosphatase: 72 IU/L (ref 39–117)
BILIRUBIN TOTAL: 0.7 mg/dL (ref 0.0–1.2)
BUN/Creatinine Ratio: 18 (ref 12–28)
BUN: 12 mg/dL (ref 8–27)
CALCIUM: 10.3 mg/dL (ref 8.7–10.3)
CHLORIDE: 101 mmol/L (ref 96–106)
CO2: 25 mmol/L (ref 20–29)
Creatinine, Ser: 0.68 mg/dL (ref 0.57–1.00)
GFR calc Af Amer: 105 mL/min/{1.73_m2} (ref >59)
GFR, EST NON AFRICAN AMERICAN: 91 mL/min/{1.73_m2} (ref >59)
Globulin, Total: 2.6 g/dL (ref 1.5–4.5)
Glucose: 88 mg/dL (ref 65–99)
POTASSIUM: 4.7 mmol/L (ref 3.5–5.2)
Sodium: 139 mmol/L (ref 134–144)
Total Protein: 7.2 g/dL (ref 6.0–8.5)

## 2017-07-05 LAB — URINALYSIS, DIPSTICK ONLY
Bilirubin, UA: NEGATIVE
Glucose, UA: NEGATIVE
Ketones, UA: NEGATIVE
Leukocytes, UA: NEGATIVE
NITRITE UA: NEGATIVE
Protein, UA: NEGATIVE
RBC, UA: NEGATIVE
Specific Gravity, UA: 1.011 (ref 1.005–1.030)
UUROB: 0.2 mg/dL (ref 0.2–1.0)
pH, UA: 7.5 (ref 5.0–7.5)

## 2017-07-05 LAB — TSH: TSH: 2.72 u[IU]/mL (ref 0.450–4.500)

## 2017-07-05 LAB — CBC WITH DIFFERENTIAL/PLATELET
BASOS: 0 %
Basophils Absolute: 0 10*3/uL (ref 0.0–0.2)
EOS (ABSOLUTE): 0.1 10*3/uL (ref 0.0–0.4)
Eos: 3 %
Hematocrit: 40.7 % (ref 34.0–46.6)
Hemoglobin: 12.9 g/dL (ref 11.1–15.9)
IMMATURE GRANULOCYTES: 0 %
Immature Grans (Abs): 0 10*3/uL (ref 0.0–0.1)
LYMPHS: 36 %
Lymphocytes Absolute: 2 10*3/uL (ref 0.7–3.1)
MCH: 30.1 pg (ref 26.6–33.0)
MCHC: 31.7 g/dL (ref 31.5–35.7)
MCV: 95 fL (ref 79–97)
Monocytes Absolute: 0.5 10*3/uL (ref 0.1–0.9)
Monocytes: 9 %
NEUTROS PCT: 52 %
Neutrophils Absolute: 2.9 10*3/uL (ref 1.4–7.0)
PLATELETS: 272 10*3/uL (ref 150–379)
RBC: 4.28 x10E6/uL (ref 3.77–5.28)
RDW: 13.5 % (ref 12.3–15.4)
WBC: 5.5 10*3/uL (ref 3.4–10.8)

## 2017-07-05 LAB — LIPID PANEL
CHOLESTEROL TOTAL: 223 mg/dL — AB (ref 100–199)
Chol/HDL Ratio: 3.1 ratio (ref 0.0–4.4)
HDL: 73 mg/dL (ref >39)
LDL Calculated: 138 mg/dL — ABNORMAL HIGH (ref 0–99)
TRIGLYCERIDES: 59 mg/dL (ref 0–149)
VLDL Cholesterol Cal: 12 mg/dL (ref 5–40)

## 2017-07-05 LAB — T4, FREE: Free T4: 1.66 ng/dL (ref 0.82–1.77)

## 2017-08-07 ENCOUNTER — Encounter: Payer: Self-pay | Admitting: Family Medicine

## 2017-08-22 DIAGNOSIS — M85851 Other specified disorders of bone density and structure, right thigh: Secondary | ICD-10-CM | POA: Diagnosis not present

## 2017-08-22 DIAGNOSIS — Z780 Asymptomatic menopausal state: Secondary | ICD-10-CM | POA: Diagnosis not present

## 2017-09-18 DIAGNOSIS — Z85828 Personal history of other malignant neoplasm of skin: Secondary | ICD-10-CM | POA: Diagnosis not present

## 2017-09-18 DIAGNOSIS — L821 Other seborrheic keratosis: Secondary | ICD-10-CM | POA: Diagnosis not present

## 2017-09-18 DIAGNOSIS — Z1283 Encounter for screening for malignant neoplasm of skin: Secondary | ICD-10-CM | POA: Diagnosis not present

## 2017-09-18 DIAGNOSIS — L649 Androgenic alopecia, unspecified: Secondary | ICD-10-CM | POA: Diagnosis not present

## 2017-09-18 DIAGNOSIS — L820 Inflamed seborrheic keratosis: Secondary | ICD-10-CM | POA: Diagnosis not present

## 2017-09-18 DIAGNOSIS — L739 Follicular disorder, unspecified: Secondary | ICD-10-CM | POA: Diagnosis not present

## 2017-09-18 DIAGNOSIS — D229 Melanocytic nevi, unspecified: Secondary | ICD-10-CM | POA: Diagnosis not present

## 2017-09-18 DIAGNOSIS — L812 Freckles: Secondary | ICD-10-CM | POA: Diagnosis not present

## 2017-09-18 DIAGNOSIS — D2272 Melanocytic nevi of left lower limb, including hip: Secondary | ICD-10-CM | POA: Diagnosis not present

## 2017-10-02 ENCOUNTER — Telehealth: Payer: Self-pay | Admitting: Family Medicine

## 2017-10-02 DIAGNOSIS — M8580 Other specified disorders of bone density and structure, unspecified site: Secondary | ICD-10-CM | POA: Insufficient documentation

## 2017-10-02 DIAGNOSIS — M85851 Other specified disorders of bone density and structure, right thigh: Secondary | ICD-10-CM

## 2017-10-02 DIAGNOSIS — M85852 Other specified disorders of bone density and structure, left thigh: Principal | ICD-10-CM

## 2017-10-02 NOTE — Telephone Encounter (Signed)
Call --- bone density scan results show OSTEOPENIA (no osteoporosis).  Current guidelines recommend the following treatment: 1.  Calcium 600mg  twice daily OR three servings of dairy daily.  2.  Vitamin D [phone] IU daily.  3.  Daily weight bearing exercise for 30 minutes.  4.  Repeat bone density scan in 2 years.

## 2017-10-14 ENCOUNTER — Encounter: Payer: Self-pay | Admitting: Family Medicine

## 2017-10-16 NOTE — Telephone Encounter (Signed)
Please call patient with bone density results.

## 2017-10-23 NOTE — Telephone Encounter (Signed)
Lm to call for results  crm in

## 2018-02-20 DIAGNOSIS — Z1231 Encounter for screening mammogram for malignant neoplasm of breast: Secondary | ICD-10-CM | POA: Diagnosis not present

## 2018-10-08 DIAGNOSIS — E034 Atrophy of thyroid (acquired): Secondary | ICD-10-CM | POA: Diagnosis not present

## 2018-10-08 DIAGNOSIS — M8589 Other specified disorders of bone density and structure, multiple sites: Secondary | ICD-10-CM | POA: Diagnosis not present

## 2018-10-08 DIAGNOSIS — Z0000 Encounter for general adult medical examination without abnormal findings: Secondary | ICD-10-CM | POA: Diagnosis not present

## 2018-10-08 DIAGNOSIS — R69 Illness, unspecified: Secondary | ICD-10-CM | POA: Diagnosis not present

## 2018-10-08 DIAGNOSIS — E669 Obesity, unspecified: Secondary | ICD-10-CM | POA: Diagnosis not present

## 2018-10-08 DIAGNOSIS — Z1231 Encounter for screening mammogram for malignant neoplasm of breast: Secondary | ICD-10-CM | POA: Diagnosis not present

## 2018-10-08 DIAGNOSIS — K760 Fatty (change of) liver, not elsewhere classified: Secondary | ICD-10-CM | POA: Diagnosis not present

## 2018-10-08 DIAGNOSIS — Z131 Encounter for screening for diabetes mellitus: Secondary | ICD-10-CM | POA: Diagnosis not present

## 2018-10-08 DIAGNOSIS — E7800 Pure hypercholesterolemia, unspecified: Secondary | ICD-10-CM | POA: Diagnosis not present

## 2018-10-14 ENCOUNTER — Other Ambulatory Visit: Payer: Self-pay | Admitting: Family Medicine

## 2018-10-14 DIAGNOSIS — Z1231 Encounter for screening mammogram for malignant neoplasm of breast: Secondary | ICD-10-CM

## 2019-02-22 DIAGNOSIS — Z8249 Family history of ischemic heart disease and other diseases of the circulatory system: Secondary | ICD-10-CM | POA: Diagnosis not present

## 2019-02-22 DIAGNOSIS — Z803 Family history of malignant neoplasm of breast: Secondary | ICD-10-CM | POA: Diagnosis not present

## 2019-02-22 DIAGNOSIS — E039 Hypothyroidism, unspecified: Secondary | ICD-10-CM | POA: Diagnosis not present

## 2019-02-22 DIAGNOSIS — R69 Illness, unspecified: Secondary | ICD-10-CM | POA: Diagnosis not present

## 2019-06-13 ENCOUNTER — Other Ambulatory Visit: Payer: Medicare HMO

## 2019-06-13 ENCOUNTER — Ambulatory Visit: Payer: Medicare HMO | Attending: Internal Medicine

## 2019-06-13 DIAGNOSIS — Z20822 Contact with and (suspected) exposure to covid-19: Secondary | ICD-10-CM

## 2019-06-13 DIAGNOSIS — E7800 Pure hypercholesterolemia, unspecified: Secondary | ICD-10-CM | POA: Diagnosis not present

## 2019-06-13 DIAGNOSIS — J069 Acute upper respiratory infection, unspecified: Secondary | ICD-10-CM | POA: Diagnosis not present

## 2019-06-13 DIAGNOSIS — R69 Illness, unspecified: Secondary | ICD-10-CM | POA: Diagnosis not present

## 2019-06-13 DIAGNOSIS — J301 Allergic rhinitis due to pollen: Secondary | ICD-10-CM | POA: Diagnosis not present

## 2019-06-14 LAB — NOVEL CORONAVIRUS, NAA: SARS-CoV-2, NAA: NOT DETECTED

## 2019-06-14 LAB — SARS-COV-2, NAA 2 DAY TAT

## 2019-07-01 DIAGNOSIS — Z0100 Encounter for examination of eyes and vision without abnormal findings: Secondary | ICD-10-CM | POA: Diagnosis not present

## 2019-07-01 DIAGNOSIS — H5203 Hypermetropia, bilateral: Secondary | ICD-10-CM | POA: Diagnosis not present

## 2019-09-01 DIAGNOSIS — R69 Illness, unspecified: Secondary | ICD-10-CM | POA: Diagnosis not present

## 2019-11-24 DIAGNOSIS — Z20822 Contact with and (suspected) exposure to covid-19: Secondary | ICD-10-CM | POA: Diagnosis not present

## 2019-12-09 DIAGNOSIS — H01135 Eczematous dermatitis of left lower eyelid: Secondary | ICD-10-CM | POA: Diagnosis not present

## 2019-12-09 DIAGNOSIS — H40023 Open angle with borderline findings, high risk, bilateral: Secondary | ICD-10-CM | POA: Diagnosis not present

## 2019-12-09 DIAGNOSIS — H2513 Age-related nuclear cataract, bilateral: Secondary | ICD-10-CM | POA: Diagnosis not present

## 2019-12-12 ENCOUNTER — Other Ambulatory Visit: Payer: Self-pay | Admitting: Family Medicine

## 2019-12-12 DIAGNOSIS — Z1231 Encounter for screening mammogram for malignant neoplasm of breast: Secondary | ICD-10-CM

## 2020-01-05 DIAGNOSIS — Z23 Encounter for immunization: Secondary | ICD-10-CM | POA: Diagnosis not present

## 2020-01-05 DIAGNOSIS — Z1231 Encounter for screening mammogram for malignant neoplasm of breast: Secondary | ICD-10-CM | POA: Diagnosis not present

## 2020-01-05 DIAGNOSIS — E7800 Pure hypercholesterolemia, unspecified: Secondary | ICD-10-CM | POA: Diagnosis not present

## 2020-01-05 DIAGNOSIS — E034 Atrophy of thyroid (acquired): Secondary | ICD-10-CM | POA: Diagnosis not present

## 2020-01-05 DIAGNOSIS — E038 Other specified hypothyroidism: Secondary | ICD-10-CM | POA: Diagnosis not present

## 2020-01-05 DIAGNOSIS — R69 Illness, unspecified: Secondary | ICD-10-CM | POA: Diagnosis not present

## 2020-01-05 DIAGNOSIS — B354 Tinea corporis: Secondary | ICD-10-CM | POA: Diagnosis not present

## 2020-01-05 DIAGNOSIS — E669 Obesity, unspecified: Secondary | ICD-10-CM | POA: Diagnosis not present

## 2020-01-05 DIAGNOSIS — Z0000 Encounter for general adult medical examination without abnormal findings: Secondary | ICD-10-CM | POA: Diagnosis not present

## 2020-01-05 DIAGNOSIS — K760 Fatty (change of) liver, not elsewhere classified: Secondary | ICD-10-CM | POA: Diagnosis not present

## 2020-03-03 DIAGNOSIS — R69 Illness, unspecified: Secondary | ICD-10-CM | POA: Diagnosis not present

## 2020-03-29 ENCOUNTER — Encounter: Payer: Medicare HMO | Admitting: Dermatology

## 2020-08-03 ENCOUNTER — Other Ambulatory Visit: Payer: Self-pay

## 2020-08-03 ENCOUNTER — Ambulatory Visit: Payer: Medicare HMO | Admitting: Dermatology

## 2020-08-03 DIAGNOSIS — D225 Melanocytic nevi of trunk: Secondary | ICD-10-CM

## 2020-08-03 DIAGNOSIS — Z85828 Personal history of other malignant neoplasm of skin: Secondary | ICD-10-CM | POA: Diagnosis not present

## 2020-08-03 DIAGNOSIS — D229 Melanocytic nevi, unspecified: Secondary | ICD-10-CM

## 2020-08-03 DIAGNOSIS — L578 Other skin changes due to chronic exposure to nonionizing radiation: Secondary | ICD-10-CM

## 2020-08-03 DIAGNOSIS — L814 Other melanin hyperpigmentation: Secondary | ICD-10-CM

## 2020-08-03 DIAGNOSIS — D239 Other benign neoplasm of skin, unspecified: Secondary | ICD-10-CM

## 2020-08-03 DIAGNOSIS — L821 Other seborrheic keratosis: Secondary | ICD-10-CM

## 2020-08-03 DIAGNOSIS — D235 Other benign neoplasm of skin of trunk: Secondary | ICD-10-CM | POA: Diagnosis not present

## 2020-08-03 DIAGNOSIS — D2271 Melanocytic nevi of right lower limb, including hip: Secondary | ICD-10-CM

## 2020-08-03 DIAGNOSIS — L820 Inflamed seborrheic keratosis: Secondary | ICD-10-CM | POA: Diagnosis not present

## 2020-08-03 DIAGNOSIS — Z1283 Encounter for screening for malignant neoplasm of skin: Secondary | ICD-10-CM | POA: Diagnosis not present

## 2020-08-03 DIAGNOSIS — D1800 Hemangioma unspecified site: Secondary | ICD-10-CM

## 2020-08-03 NOTE — Patient Instructions (Addendum)
Cryotherapy Aftercare  Wash gently with soap and water everyday.   Apply Vaseline and Band-Aid daily until healed.   Seborrheic Keratosis  What causes seborrheic keratoses? Seborrheic keratoses are harmless, common skin growths that first appear during adult life.  As time goes by, more growths appear.  Some people may develop a large number of them.  Seborrheic keratoses appear on both covered and uncovered body parts.  They are not caused by sunlight.  The tendency to develop seborrheic keratoses can be inherited.  They vary in color from skin-colored to gray, brown, or even black.  They can be either smooth or have a rough, warty surface.   Seborrheic keratoses are superficial and look as if they were stuck on the skin.  Under the microscope this type of keratosis looks like layers upon layers of skin.  That is why at times the top layer may seem to fall off, but the rest of the growth remains and re-grows.    Treatment Seborrheic keratoses do not need to be treated, but can easily be removed in the office.  Seborrheic keratoses often cause symptoms when they rub on clothing or jewelry.  Lesions can be in the way of shaving.  If they become inflamed, they can cause itching, soreness, or burning.  Removal of a seborrheic keratosis can be accomplished by freezing, burning, or surgery. If any spot bleeds, scabs, or grows rapidly, please return to have it checked, as these can be an indication of a skin cancer.   If you have any questions or concerns for your doctor, please call our main line at [phone] and press option 4 to reach your doctor's medical assistant. If no one answers, please leave a voicemail as directed and we will return your call as soon as possible. Messages left after 4 pm will be answered the following business day.   You may also send us a message via MyChart. We typically respond to MyChart messages within 1-2 business days.  For prescription refills, please ask your  pharmacy to contact our office. Our fax number is [phone].  If you have an urgent issue when the clinic is closed that cannot wait until the next business day, you can page your doctor at the number below.    Please note that while we do our best to be available for urgent issues outside of office hours, we are not available 24/7.   If you have an urgent issue and are unable to reach us, you may choose to seek medical care at your doctor's office, retail clinic, urgent care center, or emergency room.  If you have a medical emergency, please immediately call 911 or go to the emergency department.  Pager Numbers  - Dr. Kowalski: [phone]  - Dr. Moye: [phone]  - Dr. Stewart: [phone]  In the event of inclement weather, please call our main line at [phone] for an update on the status of any delays or closures.  Dermatology Medication Tips: Please keep the boxes that topical medications come in in order to help keep track of the instructions about where and how to use these. Pharmacies typically print the medication instructions only on the boxes and not directly on the medication tubes.   If your medication is too expensive, please contact our office at [phone] option 4 or send us a message through MyChart.   We are unable to tell what your co-pay for medications will be in advance as this is different depending on your insurance coverage.   However, we may be able to find a substitute medication at lower cost or fill out paperwork to get insurance to cover a needed medication.   If a prior authorization is required to get your medication covered by your insurance company, please allow us 1-2 business days to complete this process.  Drug prices often vary depending on where the prescription is filled and some pharmacies may offer cheaper prices.  The website www.goodrx.com contains coupons for medications through different pharmacies. The prices here do not  account for what the cost may be with help from insurance (it may be cheaper with your insurance), but the website can give you the price if you did not use any insurance.  - You can print the associated coupon and take it with your prescription to the pharmacy.  - You may also stop by our office during regular business hours and pick up a GoodRx coupon card.  - If you need your prescription sent electronically to a different pharmacy, notify our office through [facility] MyChart or by phone at [phone] option 4.  

## 2020-08-03 NOTE — Progress Notes (Signed)
Follow-Up Visit   Subjective  Christina Mcgee is a 70 y.o. female who presents for the following: Annual Exam (Patient here for TBSE. She has a history of BCC of the right lower neck (2018). ). She has a growth on the back of her right upper arm that she scratched recently. Also a few itchy, irritated spots on her chest, left arm.   The following portions of the chart were reviewed this encounter and updated as appropriate:       Review of Systems:  No other skin or systemic complaints except as noted in HPI or Assessment and Plan.  Objective  Well appearing patient in no apparent distress; mood and affect are within normal limits.  A full examination was performed including scalp, head, eyes, ears, nose, lips, neck, chest, axillae, abdomen, back, buttocks, bilateral upper extremities, bilateral lower extremities, hands, feet, fingers, toes, fingernails, and toenails. All findings within normal limits unless otherwise noted below.  Objective  R posterior upper arm x 1, L post upper arm x 1, L clavicle x 1 (3): Crusted pink papule on the R post upper arm  Objective  Back: Dilated pores.  Objective  Right Mid Back: 2.42mm med dark brown macule  Right Great Toe: 2.69mm med dark brown macule   Assessment & Plan   Skin cancer screening performed today.  Actinic Damage - chronic, secondary to cumulative UV radiation exposure/sun exposure over time - diffuse scaly erythematous macules with underlying dyspigmentation - Recommend daily broad spectrum sunscreen SPF 30+ to sun-exposed areas, reapply every 2 hours as needed.  - Recommend staying in the shade or wearing long sleeves, sun glasses (UVA+UVB protection) and wide brim hats (4-inch brim around the entire circumference of the hat). - Call for new or changing lesions.  History of Basal Cell Carcinoma of the Skin - No evidence of recurrence today of the right lower neck - Recommend regular full body skin exams - Recommend  daily broad spectrum sunscreen SPF 30+ to sun-exposed areas, reapply every 2 hours as needed.  - Call if any new or changing lesions are noted between office visits  Seborrheic Keratoses - Stuck-on, waxy, tan-brown papules and/or plaques  - Benign-appearing - Discussed benign etiology and prognosis. - Observe - Call for any changes  Lentigines - Scattered tan macules - Due to sun exposure - Benign-appering, observe - Recommend daily broad spectrum sunscreen SPF 30+ to sun-exposed areas, reapply every 2 hours as needed. - Call for any changes  Hemangiomas - Red papules - Discussed benign nature - Observe - Call for any changes  Inflamed seborrheic keratosis (3) R posterior upper arm x 1, L post upper arm x 1, L clavicle x 1  Prior to procedure, discussed risks of blister formation, small wound, skin dyspigmentation, or rare scar following cryotherapy.    Destruction of lesion - R posterior upper arm x 1, L post upper arm x 1, L clavicle x 1  Destruction method: cryotherapy   Informed consent: discussed and consent obtained   Lesion destroyed using liquid nitrogen: Yes   Region frozen until ice ball extended beyond lesion: Yes   Outcome: patient tolerated procedure well with no complications   Post-procedure details: wound care instructions given    Dilated pore of Winer Back  Benign, observe.    Nevus (2) Right Mid Back; Right Great Toe  Benign-appearing.  Observation.  Call clinic for new or changing moles.  Recommend daily use of broad spectrum spf 30+ sunscreen to sun-exposed  areas.    Return if symptoms worsen or fail to improve, for TBSE.  IJamesetta Orleans, CMA, am acting as scribe for Brendolyn Patty, MD .  Documentation: I have reviewed the above documentation for accuracy and completeness, and I agree with the above.  Brendolyn Patty MD

## 2022-08-28 ENCOUNTER — Encounter: Admission: RE | Payer: Self-pay | Source: Home / Self Care

## 2022-08-28 ENCOUNTER — Ambulatory Visit: Admission: RE | Admit: 2022-08-28 | Payer: Medicare HMO | Source: Home / Self Care

## 2022-08-28 SURGERY — COLONOSCOPY WITH PROPOFOL
Anesthesia: General

## 2023-03-22 ENCOUNTER — Encounter: Payer: Self-pay | Admitting: *Deleted

## 2023-04-02 ENCOUNTER — Ambulatory Visit: Payer: Medicare HMO | Admitting: Certified Registered Nurse Anesthetist

## 2023-04-02 ENCOUNTER — Ambulatory Visit
Admission: RE | Admit: 2023-04-02 | Discharge: 2023-04-02 | Disposition: A | Discharge status: Home / Self Care | Payer: Medicare HMO | Attending: Gastroenterology | Admitting: Gastroenterology

## 2023-04-02 ENCOUNTER — Encounter
Admission: RE | Disposition: A | Discharge status: Home / Self Care | Payer: Self-pay | Source: Home / Self Care | Attending: Gastroenterology

## 2023-04-02 DIAGNOSIS — Z87891 Personal history of nicotine dependence: Secondary | ICD-10-CM | POA: Diagnosis not present

## 2023-04-02 DIAGNOSIS — E039 Hypothyroidism, unspecified: Secondary | ICD-10-CM | POA: Insufficient documentation

## 2023-04-02 DIAGNOSIS — K635 Polyp of colon: Secondary | ICD-10-CM | POA: Insufficient documentation

## 2023-04-02 DIAGNOSIS — Z1211 Encounter for screening for malignant neoplasm of colon: Secondary | ICD-10-CM | POA: Diagnosis not present

## 2023-04-02 DIAGNOSIS — K640 First degree hemorrhoids: Secondary | ICD-10-CM | POA: Diagnosis not present

## 2023-04-02 DIAGNOSIS — F419 Anxiety disorder, unspecified: Secondary | ICD-10-CM | POA: Diagnosis not present

## 2023-04-02 DIAGNOSIS — Z860101 Personal history of adenomatous and serrated colon polyps: Secondary | ICD-10-CM | POA: Diagnosis present

## 2023-04-02 HISTORY — PX: COLONOSCOPY WITH PROPOFOL: SHX5780

## 2023-04-02 HISTORY — PX: POLYPECTOMY: SHX5525

## 2023-04-02 SURGERY — COLONOSCOPY WITH PROPOFOL
Anesthesia: General

## 2023-04-02 MED ORDER — SODIUM CHLORIDE 0.9 % IV SOLN
INTRAVENOUS | Status: DC
  Administered 2023-04-02: 20 mL/h via INTRAVENOUS

## 2023-04-02 MED ORDER — PROPOFOL 10 MG/ML IV BOLUS
INTRAVENOUS | Status: DC | PRN
  Administered 2023-04-02: 70 mg via INTRAVENOUS

## 2023-04-02 MED ORDER — PROPOFOL 500 MG/50ML IV EMUL
INTRAVENOUS | Status: DC | PRN
  Administered 2023-04-02: 140 ug/kg/min via INTRAVENOUS

## 2023-04-02 MED ORDER — LIDOCAINE HCL (CARDIAC) PF 100 MG/5ML IV SOSY
PREFILLED_SYRINGE | INTRAVENOUS | Status: DC | PRN
  Administered 2023-04-02: 50 mg via INTRAVENOUS

## 2023-04-02 NOTE — Interval H&P Note (Signed)
History and Physical Interval Note:  04/02/2023 8:17 AM  Christina Mcgee  has presented today for surgery, with the diagnosis of h/o ta polyps.  The various methods of treatment have been discussed with the patient and family. After consideration of risks, benefits and other options for treatment, the patient has consented to  Procedure(s): COLONOSCOPY WITH PROPOFOL (N/A) as a surgical intervention.  The patient's history has been reviewed, patient examined, no change in status, stable for surgery.  I have reviewed the patient's chart and labs.  Questions were answered to the patient's satisfaction.     Regis Bill  Ok to proceed with colonoscopy

## 2023-04-02 NOTE — Anesthesia Postprocedure Evaluation (Signed)
Anesthesia Post Note  Patient: DESHAUN BIVIANO  Procedure(s) Performed: COLONOSCOPY WITH PROPOFOL  Patient location during evaluation: PACU Anesthesia Type: General Level of consciousness: awake and awake and alert Pain management: satisfactory to patient Vital Signs Assessment: post-procedure vital signs reviewed and stable Respiratory status: spontaneous breathing Cardiovascular status: blood pressure returned to baseline Anesthetic complications: no   No notable events documented.   Last Vitals:  Vitals:   04/02/23 0736 04/02/23 0845  BP: (!) 132/117 106/68  Pulse: (!) 55 60  Resp: 20 10  Temp: (!) 35.7 C (!) 35.7 C  SpO2: 100% 100%    Last Pain:  Vitals:   04/02/23 0845  TempSrc: Tympanic  PainSc: Asleep                 VAN STAVEREN,Vernon Maish

## 2023-04-02 NOTE — H&P (Signed)
Outpatient short stay form Pre-procedure 04/02/2023  Regis Bill, MD  Primary Physician: Ethelda Chick, MD  Reason for visit:  Surveillance colonoscopy  History of present illness:    73 y/o lady with history of hypothyroidism here for surveillance colonoscopy. Last colonoscopy in 2019 with two small Ta's. No blood thinners. No family history of GI malignancies. No significant abdominal surgeries.    Current Facility-Administered Medications:    0.9 %  sodium chloride infusion, , Intravenous, Continuous, Oskar Cretella, Rossie Muskrat, MD, Last Rate: 20 mL/hr at 04/02/23 0751, 20 mL/hr at 04/02/23 0751  Medications Prior to Admission  Medication Sig Dispense Refill Last Dose/Taking   Cholecalciferol (VITAMIN D) 400 UNIT/ML LIQD Take by mouth.   Past Week   citalopram (CELEXA) 20 MG tablet Take 1 tablet (20 mg total) by mouth daily. 90 tablet 3 Past Week   levothyroxine (SYNTHROID, LEVOTHROID) 88 MCG tablet TAKE 1 TABLET BY MOUTH EVERY DAY BEFORE BREAKFAST 90 tablet 3 04/01/2023   Probiotic Product (PROBIOTIC DAILY PO) Take by mouth daily.   Past Week     No Known Allergies   Past Medical History:  Diagnosis Date   Allergy    Anxiety    Basal cell carcinoma 09/05/2016   right lower neck    Cervical dysplasia age 21   Fatty liver disease, nonalcoholic    s/p GI consultation; [facility] in [place].   Hypothyroid     Review of systems:  Otherwise negative.    Physical Exam  Gen: Alert, oriented. Appears stated age.  HEENT: PERRLA. Lungs: No respiratory distress CV: RRR Abd: soft, benign, no masses Ext: No edema    Planned procedures: Proceed with colonoscopy. The patient understands the nature of the planned procedure, indications, risks, alternatives and potential complications including but not limited to bleeding, infection, perforation, damage to internal organs and possible oversedation/side effects from anesthesia. The patient agrees and gives consent to proceed.   Please refer to procedure notes for findings, recommendations and patient disposition/instructions.     Regis Bill, MD [facility]

## 2023-04-02 NOTE — Transfer of Care (Signed)
Immediate Anesthesia Transfer of Care Note  Patient: Christina Mcgee  Procedure(s) Performed: COLONOSCOPY WITH PROPOFOL  Patient Location: Endoscopy Unit  Anesthesia Type:General  Level of Consciousness: drowsy  Airway & Oxygen Therapy: Patient Spontanous Breathing  Post-op Assessment: Report given to RN and Post -op Vital signs reviewed and stable  Post vital signs: Reviewed and stable  Last Vitals:  Vitals Value Taken Time  BP 106/68   Temp    Pulse 63   Resp 11   SpO2 100     Last Pain:  Vitals:   04/02/23 0736  TempSrc: Temporal  PainSc: 0-No pain         Complications: No notable events documented.

## 2023-04-02 NOTE — Anesthesia Preprocedure Evaluation (Signed)
Anesthesia Evaluation  Patient identified by MRN, date of birth, ID band Patient awake    Reviewed: Allergy & Precautions, NPO status , Patient's Chart, lab work & pertinent test results  Airway Mallampati: II  TM Distance: >3 FB Neck ROM: full    Dental  (+) Teeth Intact   Pulmonary neg pulmonary ROS, Patient abstained from smoking., former smoker   Pulmonary exam normal breath sounds clear to auscultation       Cardiovascular negative cardio ROS Normal cardiovascular exam Rhythm:Regular Rate:Normal     Neuro/Psych   Anxiety     negative neurological ROS  negative psych ROS   GI/Hepatic negative GI ROS, Neg liver ROS,,,  Endo/Other  negative endocrine ROSHypothyroidism    Renal/GU negative Renal ROS  negative genitourinary   Musculoskeletal   Abdominal Normal abdominal exam  (+)   Peds negative pediatric ROS (+)  Hematology negative hematology ROS (+)   Anesthesia Other Findings Past Medical History: No date: Allergy No date: Anxiety 09/05/2016: Basal cell carcinoma     Comment:  right lower neck  age 73: Cervical dysplasia No date: Fatty liver disease, nonalcoholic     Comment:  s/p GI consultation; [facility] in [place]. No date: Hypothyroid  Past Surgical History: No date: CESAREAN SECTION No date: COLONOSCOPY 04/23/2017: COLONOSCOPY WITH PROPOFOL; N/A     Comment:  Procedure: COLONOSCOPY WITH PROPOFOL;  Surgeon:               Christena Deem, MD;  Location: [facility] ENDOSCOPY;                Service: Endoscopy;  Laterality: N/A; No date: COLPOSCOPY age 47: GYNECOLOGIC CRYOSURGERY  BMI    Body Mass Index: 29.42 kg/m      Reproductive/Obstetrics negative OB ROS                             Anesthesia Physical Anesthesia Plan  ASA: 2  Anesthesia Plan: General   Post-op Pain Management:    Induction: Intravenous  PONV Risk Score and Plan: Propofol infusion and  TIVA  Airway Management Planned: Natural Airway and Nasal Cannula  Additional Equipment:   Intra-op Plan:   Post-operative Plan:   Informed Consent: I have reviewed the patients History and Physical, chart, labs and discussed the procedure including the risks, benefits and alternatives for the proposed anesthesia with the patient or authorized representative who has indicated his/her understanding and acceptance.     Dental Advisory Given  Plan Discussed with: CRNA and Surgeon  Anesthesia Plan Comments:        Anesthesia Quick Evaluation

## 2023-04-02 NOTE — Op Note (Signed)
[facility] Patient Name: Christina Mcgee Procedure Date: 04/02/2023 8:13 AM MRN: [id number] Account #: [id number] Date of Birth: 01/08/1951 Admit Type: Outpatient Age: 73 Room: [facility] ENDO ROOM 3 Gender: Female Note Status: Finalized Instrument Name: Prentice Docker [id number] Procedure:             Colonoscopy Indications:           Surveillance: Personal history of adenomatous polyps                         on last colonoscopy > 5 years ago Providers:             Eather Colas MD, MD Medicines:             Monitored Anesthesia Care Complications:         No immediate complications. Estimated blood loss:                         Minimal. Procedure:             Pre-Anesthesia Assessment:                        - Prior to the procedure, a History and Physical was                         performed, and patient medications and allergies were                         reviewed. The patient is competent. The risks and                         benefits of the procedure and the sedation options and                         risks were discussed with the patient. All questions                         were answered and informed consent was obtained.                         Patient identification and proposed procedure were                         verified by the physician, the nurse, the                         anesthesiologist, the anesthetist and the technician                         in the endoscopy suite. Mental Status Examination:                         alert and oriented. Airway Examination: normal                         oropharyngeal airway and neck mobility. Respiratory                         Examination: clear to auscultation. CV Examination:  normal. Prophylactic Antibiotics: The patient does not                         require prophylactic antibiotics. Prior                         Anticoagulants: The patient has taken no  anticoagulant                         or antiplatelet agents. ASA Grade Assessment: II - A                         patient with mild systemic disease. After reviewing                         the risks and benefits, the patient was deemed in                         satisfactory condition to undergo the procedure. The                         anesthesia plan was to use monitored anesthesia care                         (MAC). Immediately prior to administration of                         medications, the patient was re-assessed for adequacy                         to receive sedatives. The heart rate, respiratory                         rate, oxygen saturations, blood pressure, adequacy of                         pulmonary ventilation, and response to care were                         monitored throughout the procedure. The physical                         status of the patient was re-assessed after the                         procedure.                        After obtaining informed consent, the colonoscope was                         passed under direct vision. Throughout the procedure,                         the patient's blood pressure, pulse, and oxygen                         saturations were monitored continuously. The  Colonoscope was introduced through the anus and                         advanced to the the terminal ileum, with                         identification of the appendiceal orifice and IC                         valve. The colonoscopy was performed without                         difficulty. The patient tolerated the procedure well.                         The quality of the bowel preparation was good. The                         terminal ileum, ileocecal valve, appendiceal orifice,                         and rectum were photographed. Findings:      The perianal and digital rectal examinations were normal.      The terminal ileum appeared  normal.      A 1 mm polyp was found in the appendiceal orifice. The polyp was       sessile. The polyp was removed with a jumbo cold forceps. Resection and       retrieval were complete. Estimated blood loss was minimal.      Internal hemorrhoids were found during retroflexion. The hemorrhoids       were Grade I (internal hemorrhoids that do not prolapse).      The exam was otherwise without abnormality on direct and retroflexion       views. Impression:            - The examined portion of the ileum was normal.                        - One 1 mm polyp at the appendiceal orifice, removed                         with a jumbo cold forceps. Resected and retrieved.                        - Internal hemorrhoids.                        - The examination was otherwise normal on direct and                         retroflexion views. Recommendation:        - Discharge patient to home.                        - Resume previous diet.                        - Continue present medications.                        -  Await pathology results.                        - Repeat colonoscopy in 7 years for surveillance.                        - Return to referring physician as previously                         scheduled. Procedure Code(s):     --- Professional ---                        [phone], Colonoscopy, flexible; with biopsy, single or                         multiple Diagnosis Code(s):     --- Professional ---                        Z86.010, Personal history of colonic polyps                        K64.0, First degree hemorrhoids                        D12.1, Benign neoplasm of appendix CPT copyright 2022 American Medical Association. All rights reserved. The codes documented in this report are preliminary and upon coder review may  be revised to meet current compliance requirements. Eather Colas MD, MD 04/02/2023 8:45:41 AM Number of Addenda: 0 Note Initiated On: 04/02/2023 8:13 AM Scope Withdrawal  Time: 0 hours 10 minutes 6 seconds  Total Procedure Duration: 0 hours 16 minutes 3 seconds  Estimated Blood Loss:  Estimated blood loss was minimal.      [facility]

## 2023-04-03 ENCOUNTER — Encounter: Payer: Self-pay | Admitting: Gastroenterology

## 2023-04-03 LAB — SURGICAL PATHOLOGY

## 2024-02-19 ENCOUNTER — Ambulatory Visit: Payer: Medicare HMO | Admitting: Dermatology

## 2024-02-19 DIAGNOSIS — D1801 Hemangioma of skin and subcutaneous tissue: Secondary | ICD-10-CM

## 2024-02-19 DIAGNOSIS — L304 Erythema intertrigo: Secondary | ICD-10-CM

## 2024-02-19 DIAGNOSIS — R202 Paresthesia of skin: Secondary | ICD-10-CM

## 2024-02-19 DIAGNOSIS — Z1283 Encounter for screening for malignant neoplasm of skin: Secondary | ICD-10-CM

## 2024-02-19 DIAGNOSIS — L814 Other melanin hyperpigmentation: Secondary | ICD-10-CM

## 2024-02-19 DIAGNOSIS — D229 Melanocytic nevi, unspecified: Secondary | ICD-10-CM

## 2024-02-19 DIAGNOSIS — L821 Other seborrheic keratosis: Secondary | ICD-10-CM

## 2024-02-19 DIAGNOSIS — D239 Other benign neoplasm of skin, unspecified: Secondary | ICD-10-CM

## 2024-02-19 DIAGNOSIS — L820 Inflamed seborrheic keratosis: Secondary | ICD-10-CM

## 2024-02-19 DIAGNOSIS — L578 Other skin changes due to chronic exposure to nonionizing radiation: Secondary | ICD-10-CM

## 2024-02-19 DIAGNOSIS — Z85828 Personal history of other malignant neoplasm of skin: Secondary | ICD-10-CM

## 2024-02-19 MED ORDER — KETOCONAZOLE 2 % EX CREA: TOPICAL_CREAM | CUTANEOUS | 11 refills | Status: AC

## 2024-02-19 MED ORDER — HYDROCORTISONE 2.5 % EX CREA: TOPICAL_CREAM | CUTANEOUS | 3 refills | Status: AC

## 2024-02-19 NOTE — Progress Notes (Signed)
 New Patient Visit   Subjective  Christina Mcgee is a 73 y.o. female who presents for the following: Skin Cancer Screening and Full Body Skin Exam  The patient presents for Total-Body Skin Exam (TBSE) for skin cancer screening and mole check. The patient has spots, moles and lesions to be evaluated, some may be new or changing. She has a few itchy spots on the left back, right shoulder. History of BCC of the right lower neck, 2018.  The following portions of the chart were reviewed this encounter and updated as appropriate: medications, allergies, medical history  Review of Systems:  No other skin or systemic complaints except as noted in HPI or Assessment and Plan.  Objective  Well appearing patient in no apparent distress; mood and affect are within normal limits.  A full examination was performed including scalp, head, eyes, ears, nose, lips, neck, chest, axillae, abdomen, back, buttocks, bilateral upper extremities, bilateral lower extremities, hands, feet, fingers, toes, fingernails, and toenails. All findings within normal limits unless otherwise noted below.   Relevant physical exam findings are noted in the Assessment and Plan.  left spinal mid back x 3, top of R shoulder at bra line x 1, L lower neck x 1, R lower neck x 1 (6) Erythematous stuck-on, waxy papule or plaque  Assessment & Plan   SKIN CANCER SCREENING PERFORMED TODAY.  ACTINIC DAMAGE - Chronic condition, secondary to cumulative UV/sun exposure - diffuse scaly erythematous macules with underlying dyspigmentation - Recommend daily broad spectrum sunscreen SPF 30+ to sun-exposed areas, reapply every 2 hours as needed.  - Staying in the shade or wearing long sleeves, sun glasses (UVA+UVB protection) and wide brim hats (4-inch brim around the entire circumference of the hat) are also recommended for sun protection.  - Call for new or changing lesions.  LENTIGINES, SEBORRHEIC KERATOSES, HEMANGIOMAS - Benign normal skin  lesions - Benign-appearing - Call for any changes  MELANOCYTIC NEVI - Tan-brown and/or pink-flesh-colored symmetric macules and papules - Right Mid Back: 2.5 mm med dark brown macule - Right lower back: 7 x 5 mm medium light brown speckled macule - Right Great Toe: 2.5 mm med dark brown macule - Benign appearing on exam today - Observation - Call clinic for new or changing moles - Recommend daily use of broad spectrum spf 30+ sunscreen to sun-exposed areas.   History of Basal Cell Carcinoma of the Skin Right lower neck, 2018 - No evidence of recurrence today - Recommend regular full body skin exams - Recommend daily broad spectrum sunscreen SPF 30+ to sun-exposed areas, reapply every 2 hours as needed.  - Call if any new or changing lesions are noted between office visits  DERMATOFIBROMA Exam: Firm pink/brown papulenodule with dimple sign at left ankle  Treatment Plan: A dermatofibroma is a benign growth possibly related to trauma, such as an insect bite, cut from shaving, or inflamed acne-type bump.  Treatment options to remove include shave or excision with resulting scar and risk of recurrence.  Since benign-appearing and not bothersome, will observe for now.   INTERTRIGO Exam: Erythematous macerated patches in inferior abdominal sub pannus body fold  Chronic and persistent condition with duration or expected duration over one year. Condition is bothersome/symptomatic for patient. Currently flared.  Intertrigo is a chronic recurrent rash that occurs in skin fold areas that may be associated with friction; heat; moisture; yeast; fungus; and bacteria.  It is exacerbated by increased movement / activity; sweating; and higher atmospheric temperature.  Use  of an absorbant powder such as Zeasorb AF powder or other OTC antifungal powder to the area daily can prevent rash recurrence. Other options to help keep the area dry include blow drying the area after bathing or using antiperspirant  products such as Duradry sweat minimizing gel.  Treatment Plan: Start ketoconazole  2% cream once or twice a day as needed for rash.  Start hydrocortisone  2.5% cream once or twice a day for up to 2 weeks as needed for rash.   Mix equal amounts of hydrocortisone  2.5% cream with ketaconazole 2% cream and apply to affected areas twice a day.  If improved, decrease to hydrocortisone  and ketaconazole mixed once a day. If still clear, decrease to ketaconazole cream only, once daily to help prevent flares.  Do not use the hydrocortisone  cream for maintenance, since long term use of a topical steroid can thin the skin.  May repeat regimen as needed for flares.   Topical steroids (such as triamcinolone, fluocinolone, fluocinonide, mometasone, clobetasol, halobetasol, betamethasone, hydrocortisone ) can cause thinning and lightening of the skin if they are used for too long in the same area. Your physician has selected the right strength medicine for your problem and area affected on the body. Please use your medication only as directed by your physician to prevent side effects.   To prevent recurrence, recommend OTC Zeasorb AF powder to body folds daily after shower.  It is often found in the athlete's foot section in the pharmacy.  Avoid using powders that contain cornstarch.    NOTALGIA PARESTHETICA Exam: left mid upper back clear Chronic condition without cure secondary to pinched nerve along spine causing itching or sensation changes in an area of skin. Chronic rubbing or scratching causes darkening of the skin.  OTC treatments which can help with itch include numbing creams like pramoxine or lidocaine  which temporarily reduce itch or Capsaicin-containing creams which cause a burning sensation but which sometimes over time will reset the nerves to stop producing itch.  If you choose to use Capsaicin cream, it is recommended to use it 5 times daily for 1 week followed by 3 times daily for 3-6 weeks. You may  have to continue using it long-term.  If not doing well with OTC options, could consider [employer] compounded prescription anti-itch cream with Amitriptyline 5% / Lidocaine  5% / Pramoxine 1% or Amitriptyline 5% / Gabapentin 10% / Lidocaine  5% Cream or other prescription cream or pill options.   INFLAMED SEBORRHEIC KERATOSIS (6) left spinal mid back x 3, top of R shoulder at bra line x 1, L lower neck x 1, R lower neck x 1 (6) Symptomatic, irritating, patient would like treated. Destruction of lesion - left spinal mid back x 3, top of R shoulder at bra line x 1, L lower neck x 1, R lower neck x 1 (6)  Destruction method: cryotherapy   Informed consent: discussed and consent obtained   Lesion destroyed using liquid nitrogen: Yes   Region frozen until ice ball extended beyond lesion: Yes   Outcome: patient tolerated procedure well with no complications   Post-procedure details: wound care instructions given   Additional details:  Prior to procedure, discussed risks of blister formation, small wound, skin dyspigmentation, or rare scar following cryotherapy. Recommend Vaseline ointment to treated areas while healing.   ERYTHEMA INTERTRIGO   Related Medications ketoconazole  (NIZORAL ) 2 % cream Apply 1-2 times a day to skin folds as directed. hydrocortisone  2.5 % cream Apply 1-2 times a day to skin folds  as directed.  Return 1-2 years, for TBSE.  IAndrea Kerns, CMA, am acting as scribe for Rexene Rattler, MD .   Documentation: I have reviewed the above documentation for accuracy and completeness, and I agree with the above.  Rexene Rattler, MD

## 2024-02-19 NOTE — Patient Instructions (Addendum)
 Cryotherapy Aftercare  Wash gently with soap and water everyday.   Apply Vaseline and Band-Aid daily until healed.   Intertrigo is a chronic recurrent rash that occurs in skin fold areas that may be associated with friction; heat; moisture; yeast; fungus; and bacteria.  It is exacerbated by increased movement / activity; sweating; and higher atmospheric temperature.  Use of an absorbant powder such as Zeasorb AF powder or other OTC antifungal powder to the area daily can prevent rash recurrence. Other options to help keep the area dry include blow drying the area after bathing or using antiperspirant products such as Duradry sweat minimizing gel.  Start ketoconazole  2% cream once or twice a day as needed for rash.  Start hydrocortisone  2.5% cream once or twice a day for up to 2 weeks as needed for rash.   Mix equal amounts of hydrocortisone  2.5% cream with ketaconazole 2% cream and apply to affected areas twice a day.  If improved, decrease to hydrocortisone  and ketaconazole mixed once a day. If still clear, decrease to ketaconazole cream only, once daily to help prevent flares.  Do not use the hydrocortisone  cream for maintenance, since long term use of a topical steroid can thin the skin.  May repeat regimen as needed for flares.   Topical steroids (such as triamcinolone, fluocinolone, fluocinonide, mometasone, clobetasol, halobetasol, betamethasone, hydrocortisone ) can cause thinning and lightening of the skin if they are used for too long in the same area. Your physician has selected the right strength medicine for your problem and area affected on the body. Please use your medication only as directed by your physician to prevent side effects.   To prevent recurrence, recommend OTC Zeasorb AF powder to body folds daily after shower.  It is often found in the athlete's foot section in the pharmacy.  Avoid using powders that contain cornstarch.   Seborrheic Keratosis  What causes seborrheic  keratoses? Seborrheic keratoses are harmless, common skin growths that first appear during adult life.  As time goes by, more growths appear.  Some people may develop a large number of them.  Seborrheic keratoses appear on both covered and uncovered body parts.  They are not caused by sunlight.  The tendency to develop seborrheic keratoses can be inherited.  They vary in color from skin-colored to gray, brown, or even black.  They can be either smooth or have a rough, warty surface.   Seborrheic keratoses are superficial and look as if they were stuck on the skin.  Under the microscope this type of keratosis looks like layers upon layers of skin.  That is why at times the top layer may seem to fall off, but the rest of the growth remains and re-grows.    Treatment Seborrheic keratoses do not need to be treated, but can easily be removed in the office.  Seborrheic keratoses often cause symptoms when they rub on clothing or jewelry.  Lesions can be in the way of shaving.  If they become inflamed, they can cause itching, soreness, or burning.  Removal of a seborrheic keratosis can be accomplished by freezing, burning, or surgery. If any spot bleeds, scabs, or grows rapidly, please return to have it checked, as these can be an indication of a skin cancer.   Due to recent changes in healthcare laws, you may see results of your pathology and/or laboratory studies on MyChart before the doctors have had a chance to review them. We understand that in some cases there may be results that are  confusing or concerning to you. Please understand that not all results are received at the same time and often the doctors may need to interpret multiple results in order to provide you with the best plan of care or course of treatment. Therefore, we ask that you please give us  2 business days to thoroughly review all your results before contacting the office for clarification. Should we see a critical lab result, you will be  contacted sooner.   If You Need Anything After Your Visit  If you have any questions or concerns for your doctor, please call our main line at [phone] and press option 4 to reach your doctor's medical assistant. If no one answers, please leave a voicemail as directed and we will return your call as soon as possible. Messages left after 4 pm will be answered the following business day.   You may also send us  a message via MyChart. We typically respond to MyChart messages within 1-2 business days.  For prescription refills, please ask your pharmacy to contact our office. Our fax number is [phone].  If you have an urgent issue when the clinic is closed that cannot wait until the next business day, you can page your doctor at the number below.    Please note that while we do our best to be available for urgent issues outside of office hours, we are not available 24/7.   If you have an urgent issue and are unable to reach us , you may choose to seek medical care at your doctor's office, retail clinic, urgent care center, or emergency room.  If you have a medical emergency, please immediately call 911 or go to the emergency department.  Pager Numbers  - Dr. Hester: [phone]  - Dr. Jackquline: [phone]  - Dr. Claudene: [phone]   - Dr. Raymund: [phone]  In the event of inclement weather, please call our main line at [phone] for an update on the status of any delays or closures.  Dermatology Medication Tips: Please keep the boxes that topical medications come in in order to help keep track of the instructions about where and how to use these. Pharmacies typically print the medication instructions only on the boxes and not directly on the medication tubes.   If your medication is too expensive, please contact our office at [phone] option 4 or send us  a message through MyChart.   We are unable to tell what your co-pay for medications will be in advance as this  is different depending on your insurance coverage. However, we may be able to find a substitute medication at lower cost or fill out paperwork to get insurance to cover a needed medication.   If a prior authorization is required to get your medication covered by your insurance company, please allow us  1-2 business days to complete this process.  Drug prices often vary depending on where the prescription is filled and some pharmacies may offer cheaper prices.  The website www.goodrx.com contains coupons for medications through different pharmacies. The prices here do not account for what the cost may be with help from insurance (it may be cheaper with your insurance), but the website can give you the price if you did not use any insurance.  - You can print the associated coupon and take it with your prescription to the pharmacy.  - You may also stop by our office during regular business hours and pick up a GoodRx coupon card.  - If you need your prescription sent electronically  to a different pharmacy, notify our office through [facility] or by phone at [phone] option 4.     Si Usted Necesita Algo Despus de Su Visita  Tambin puede enviarnos un mensaje a travs de Clinical Cytogeneticist. Por lo general respondemos a los mensajes de MyChart en el transcurso de 1 a 2 das hbiles.  Para renovar recetas, por favor pida a su farmacia que se ponga en contacto con nuestra oficina. Randi [place] de fax es [place] [phone].  Si tiene un asunto urgente cuando la clnica est cerrada y que no puede esperar hasta el siguiente da hbil, puede llamar/localizar a su doctor(a) al nmero que aparece a continuacin.   Por favor, tenga en cuenta que aunque hacemos todo lo posible para estar disponibles para asuntos urgentes fuera del horario de [place], no estamos disponibles las 24 horas del da, los 7 [address]  [address] de la [place].   Si tiene un problema urgente y no puede comunicarse con nosotros, puede optar por  buscar atencin mdica  en el consultorio de su doctor(a), en una clnica privada, en un centro de atencin urgente o en una sala de emergencias.  Si tiene engineer, drilling, por favor llame inmediatamente al 911 o vaya a la sala de emergencias.  Nmeros de bper  - Dr. Hester: [phone]  - Dra. Jackquline: [phone]  - Dr. Claudene: [phone]  - Dra. Kitts: [phone]  En caso de inclemencias del [place], por favor llame a nuestra lnea principal al [phone] para una actualizacin sobre el estado de cualquier retraso o cierre.  Consejos para la medicacin en dermatologa: Por favor, guarde las cajas en las que vienen los medicamentos de uso tpico para ayudarle a seguir las instrucciones sobre dnde y cmo usarlos. Las farmacias generalmente imprimen las instrucciones del medicamento slo en las cajas y no directamente en los tubos del [place].   Si su medicamento es muy caro, por favor, pngase en contacto con landry rieger llamando al [phone] y presione la opcin 4 o envenos un mensaje a travs de Clinical Cytogeneticist.   No podemos decirle cul ser su copago por los medicamentos por adelantado ya que esto es diferente dependiendo de la cobertura de su seguro. Sin embargo, es posible que podamos encontrar un medicamento sustituto a audiological scientist un formulario para que el seguro cubra el medicamento que se considera necesario.   Si se requiere una autorizacin previa para que su compaa de seguros cubra su medicamento, por favor permtanos de 1 a 2 das hbiles para completar este proceso.  Los precios de los medicamentos varan con frecuencia dependiendo del environmental consultant de dnde se surte la receta y alguna farmacias pueden ofrecer precios ms baratos.  El sitio web www.goodrx.com tiene cupones para medicamentos de health and safety inspector. Los precios aqu no tienen en cuenta lo que podra costar con la ayuda del seguro (puede ser ms barato con su seguro), pero el sitio web  puede darle el precio si no utiliz tourist information centre manager.  - Puede imprimir el cupn correspondiente y llevarlo con su receta a la farmacia.  - Tambin puede pasar por nuestra oficina durante el horario de atencin regular y education officer, museum una tarjeta de cupones de GoodRx.  - Si necesita que su receta se enve electrnicamente a una farmacia diferente, informe a nuestra oficina a travs de MyChart de [place] o por telfono llamando al [phone] y presione la opcin 4.

## 2025-08-25 ENCOUNTER — Encounter: Admitting: Dermatology
